# Patient Record
Sex: Female | Born: 1950 | Race: White | Hispanic: No | Marital: Married | Smoking: Former smoker
Health system: Southern US, Community
[De-identification: ages and names within clinical notes are randomized; demographics above are authoritative.]

## PROBLEM LIST (undated history)

## (undated) DIAGNOSIS — Z780 Asymptomatic menopausal state: Secondary | ICD-10-CM

## (undated) DIAGNOSIS — M1990 Unspecified osteoarthritis, unspecified site: Secondary | ICD-10-CM

## (undated) DIAGNOSIS — F32A Depression, unspecified: Secondary | ICD-10-CM

## (undated) DIAGNOSIS — C801 Malignant (primary) neoplasm, unspecified: Secondary | ICD-10-CM

## (undated) DIAGNOSIS — E785 Hyperlipidemia, unspecified: Secondary | ICD-10-CM

## (undated) DIAGNOSIS — K219 Gastro-esophageal reflux disease without esophagitis: Secondary | ICD-10-CM

## (undated) DIAGNOSIS — S86012A Strain of left Achilles tendon, initial encounter: Secondary | ICD-10-CM

## (undated) DIAGNOSIS — F329 Major depressive disorder, single episode, unspecified: Secondary | ICD-10-CM

## (undated) DIAGNOSIS — D0510 Intraductal carcinoma in situ of unspecified breast: Secondary | ICD-10-CM

## (undated) HISTORY — DX: Hyperlipidemia, unspecified: E78.5

## (undated) HISTORY — DX: Gastro-esophageal reflux disease without esophagitis: K21.9

## (undated) HISTORY — DX: Malignant (primary) neoplasm, unspecified: C80.1

## (undated) HISTORY — DX: Intraductal carcinoma in situ of unspecified breast: D05.10

## (undated) HISTORY — DX: Depression, unspecified: F32.A

## (undated) HISTORY — PX: BREAST SURGERY: SHX581

## (undated) HISTORY — DX: Unspecified osteoarthritis, unspecified site: M19.90

## (undated) HISTORY — DX: Asymptomatic menopausal state: Z78.0

## (undated) HISTORY — DX: Major depressive disorder, single episode, unspecified: F32.9

---

## 1991-06-27 HISTORY — PX: OOPHORECTOMY: SHX86

## 1993-05-26 HISTORY — PX: ABDOMINAL HYSTERECTOMY: SHX81

## 1997-06-26 HISTORY — PX: ANTERIOR CERVICAL DECOMP/DISCECTOMY FUSION: SHX1161

## 2005-08-24 HISTORY — PX: CHOLECYSTECTOMY: SHX55

## 2005-08-26 ENCOUNTER — Observation Stay (HOSPITAL_COMMUNITY): Admission: EM | Admit: 2005-08-26 | Discharge: 2005-08-26 | Payer: Self-pay | Admitting: Emergency Medicine

## 2005-08-28 ENCOUNTER — Inpatient Hospital Stay (HOSPITAL_COMMUNITY): Admission: EM | Admit: 2005-08-28 | Discharge: 2005-08-30 | Payer: Self-pay | Admitting: Emergency Medicine

## 2005-10-12 ENCOUNTER — Ambulatory Visit (HOSPITAL_COMMUNITY): Admission: RE | Admit: 2005-10-12 | Discharge: 2005-10-12 | Payer: Self-pay | Admitting: Surgery

## 2005-10-12 ENCOUNTER — Encounter (INDEPENDENT_AMBULATORY_CARE_PROVIDER_SITE_OTHER): Payer: Self-pay | Admitting: *Deleted

## 2010-02-24 DIAGNOSIS — D0510 Intraductal carcinoma in situ of unspecified breast: Secondary | ICD-10-CM

## 2010-02-24 HISTORY — DX: Intraductal carcinoma in situ of unspecified breast: D05.10

## 2010-03-21 ENCOUNTER — Encounter: Admission: RE | Admit: 2010-03-21 | Discharge: 2010-03-21 | Payer: Self-pay | Admitting: Radiology

## 2010-04-20 ENCOUNTER — Encounter: Admission: RE | Admit: 2010-04-20 | Discharge: 2010-04-20 | Payer: Self-pay | Admitting: General Surgery

## 2010-04-22 ENCOUNTER — Encounter: Admission: RE | Admit: 2010-04-22 | Discharge: 2010-04-22 | Payer: Self-pay | Admitting: General Surgery

## 2010-04-22 ENCOUNTER — Ambulatory Visit (HOSPITAL_BASED_OUTPATIENT_CLINIC_OR_DEPARTMENT_OTHER): Admission: RE | Admit: 2010-04-22 | Discharge: 2010-04-22 | Payer: Self-pay | Admitting: General Surgery

## 2010-04-28 ENCOUNTER — Ambulatory Visit: Payer: Self-pay | Admitting: Oncology

## 2010-05-03 ENCOUNTER — Ambulatory Visit
Admission: RE | Admit: 2010-05-03 | Discharge: 2010-07-21 | Payer: Self-pay | Source: Home / Self Care | Attending: Radiation Oncology | Admitting: Radiation Oncology

## 2010-05-23 LAB — CBC WITH DIFFERENTIAL/PLATELET
Basophils Absolute: 0.1 10*3/uL (ref 0.0–0.1)
Eosinophils Absolute: 0.3 10*3/uL (ref 0.0–0.5)
HGB: 13.1 g/dL (ref 11.6–15.9)
MCV: 91.1 fL (ref 79.5–101.0)
MONO#: 0.7 10*3/uL (ref 0.1–0.9)
MONO%: 8.2 % (ref 0.0–14.0)
NEUT#: 5.6 10*3/uL (ref 1.5–6.5)
Platelets: 276 10*3/uL (ref 145–400)
RDW: 13.8 % (ref 11.2–14.5)
WBC: 8.6 10*3/uL (ref 3.9–10.3)

## 2010-05-23 LAB — COMPREHENSIVE METABOLIC PANEL
Albumin: 4.2 g/dL (ref 3.5–5.2)
Alkaline Phosphatase: 96 U/L (ref 39–117)
BUN: 18 mg/dL (ref 6–23)
CO2: 27 mEq/L (ref 19–32)
Calcium: 9.5 mg/dL (ref 8.4–10.5)
Glucose, Bld: 98 mg/dL (ref 70–99)
Potassium: 4.7 mEq/L (ref 3.5–5.3)
Sodium: 141 mEq/L (ref 135–145)
Total Protein: 6.7 g/dL (ref 6.0–8.3)

## 2010-07-18 ENCOUNTER — Ambulatory Visit: Payer: Self-pay | Admitting: Oncology

## 2010-07-20 LAB — CBC WITH DIFFERENTIAL/PLATELET
BASO%: 0 % (ref 0.0–2.0)
EOS%: 4 % (ref 0.0–7.0)
MCH: 31 pg (ref 25.1–34.0)
MCHC: 34 g/dL (ref 31.5–36.0)
MONO%: 8.6 % (ref 0.0–14.0)
RBC: 4.47 10*6/uL (ref 3.70–5.45)
RDW: 13.4 % (ref 11.2–14.5)
lymph#: 0.9 10*3/uL (ref 0.9–3.3)

## 2010-07-20 LAB — BASIC METABOLIC PANEL
Chloride: 101 mEq/L (ref 96–112)
Potassium: 4.2 mEq/L (ref 3.5–5.3)

## 2010-07-27 ENCOUNTER — Ambulatory Visit: Payer: Self-pay | Admitting: Radiation Oncology

## 2010-08-08 ENCOUNTER — Encounter (HOSPITAL_BASED_OUTPATIENT_CLINIC_OR_DEPARTMENT_OTHER): Payer: BC Managed Care – PPO | Admitting: Oncology

## 2010-08-08 DIAGNOSIS — Z800 Family history of malignant neoplasm of digestive organs: Secondary | ICD-10-CM

## 2010-08-08 DIAGNOSIS — D0590 Unspecified type of carcinoma in situ of unspecified breast: Secondary | ICD-10-CM

## 2010-08-08 DIAGNOSIS — I10 Essential (primary) hypertension: Secondary | ICD-10-CM

## 2010-08-08 DIAGNOSIS — C50419 Malignant neoplasm of upper-outer quadrant of unspecified female breast: Secondary | ICD-10-CM

## 2010-08-18 ENCOUNTER — Ambulatory Visit: Payer: BC Managed Care – PPO | Attending: Radiation Oncology | Admitting: Radiation Oncology

## 2010-09-07 LAB — COMPREHENSIVE METABOLIC PANEL
ALT: 50 U/L — ABNORMAL HIGH (ref 0–35)
AST: 35 U/L (ref 0–37)
Albumin: 3.9 g/dL (ref 3.5–5.2)
Alkaline Phosphatase: 104 U/L (ref 39–117)
BUN: 17 mg/dL (ref 6–23)
Chloride: 102 mEq/L (ref 96–112)
GFR calc Af Amer: >60 mL/min (ref >60)
Potassium: 4.6 mEq/L (ref 3.5–5.1)
Sodium: 141 mEq/L (ref 135–145)
Total Bilirubin: 1 mg/dL (ref 0.3–1.2)
Total Protein: 7.3 g/dL (ref 6.0–8.3)

## 2010-09-07 LAB — DIFFERENTIAL
Basophils Absolute: 0.1 10*3/uL (ref 0.0–0.1)
Basophils Relative: 1 % (ref 0–1)
Eosinophils Relative: 3 % (ref 0–5)
Monocytes Absolute: 1 10*3/uL (ref 0.1–1.0)
Monocytes Relative: 9 % (ref 3–12)
Neutro Abs: 7.7 10*3/uL (ref 1.7–7.7)

## 2010-09-07 LAB — CBC
MCV: 91.1 fL (ref 78.0–100.0)
Platelets: 295 10*3/uL (ref 150–400)
RBC: 4.82 MIL/uL (ref 3.87–5.11)
RDW: 13.6 % (ref 11.5–15.5)
WBC: 11 10*3/uL — ABNORMAL HIGH (ref 4.0–10.5)

## 2010-09-07 LAB — CANCER ANTIGEN 27.29: CA 27.29: 47 U/mL — ABNORMAL HIGH (ref 0–39)

## 2010-09-15 ENCOUNTER — Ambulatory Visit: Payer: BC Managed Care – PPO | Admitting: Internal Medicine

## 2010-10-17 ENCOUNTER — Other Ambulatory Visit: Payer: Self-pay | Admitting: Oncology

## 2010-10-17 ENCOUNTER — Encounter (HOSPITAL_BASED_OUTPATIENT_CLINIC_OR_DEPARTMENT_OTHER): Payer: BC Managed Care – PPO | Admitting: Oncology

## 2010-10-17 DIAGNOSIS — F329 Major depressive disorder, single episode, unspecified: Secondary | ICD-10-CM

## 2010-10-17 DIAGNOSIS — Z800 Family history of malignant neoplasm of digestive organs: Secondary | ICD-10-CM

## 2010-10-17 DIAGNOSIS — C50419 Malignant neoplasm of upper-outer quadrant of unspecified female breast: Secondary | ICD-10-CM

## 2010-10-17 DIAGNOSIS — I10 Essential (primary) hypertension: Secondary | ICD-10-CM

## 2010-10-17 DIAGNOSIS — D0590 Unspecified type of carcinoma in situ of unspecified breast: Secondary | ICD-10-CM

## 2010-10-17 LAB — CBC WITH DIFFERENTIAL/PLATELET
BASO%: 0.4 % (ref 0.0–2.0)
Eosinophils Absolute: 0.1 10*3/uL (ref 0.0–0.5)
HCT: 40.7 % (ref 34.8–46.6)
LYMPH%: 17.8 % (ref 14.0–49.7)
MCHC: 33.8 g/dL (ref 31.5–36.0)
MCV: 92.1 fL (ref 79.5–101.0)
MONO#: 0.6 10*3/uL (ref 0.1–0.9)
MONO%: 6.7 % (ref 0.0–14.0)
NEUT%: 73.8 % (ref 38.4–76.8)
Platelets: 255 10*3/uL (ref 145–400)
WBC: 9.6 10*3/uL (ref 3.9–10.3)

## 2010-10-17 LAB — COMPREHENSIVE METABOLIC PANEL
Alkaline Phosphatase: 62 U/L (ref 39–117)
CO2: 23 mEq/L (ref 19–32)
Creatinine, Ser: 0.84 mg/dL (ref 0.40–1.20)
Glucose, Bld: 146 mg/dL — ABNORMAL HIGH (ref 70–99)
Total Bilirubin: 0.5 mg/dL (ref 0.3–1.2)

## 2010-11-02 ENCOUNTER — Encounter (INDEPENDENT_AMBULATORY_CARE_PROVIDER_SITE_OTHER): Payer: Self-pay | Admitting: General Surgery

## 2010-11-03 ENCOUNTER — Ambulatory Visit (INDEPENDENT_AMBULATORY_CARE_PROVIDER_SITE_OTHER): Payer: BC Managed Care – PPO | Admitting: Internal Medicine

## 2010-11-03 DIAGNOSIS — C50919 Malignant neoplasm of unspecified site of unspecified female breast: Secondary | ICD-10-CM

## 2010-11-03 DIAGNOSIS — E785 Hyperlipidemia, unspecified: Secondary | ICD-10-CM

## 2010-11-03 DIAGNOSIS — N951 Menopausal and female climacteric states: Secondary | ICD-10-CM

## 2010-11-03 DIAGNOSIS — F329 Major depressive disorder, single episode, unspecified: Secondary | ICD-10-CM

## 2010-11-11 NOTE — Discharge Summary (Signed)
[name], [name]             ACCOUNT NO.:  [id number]   MEDICAL RECORD NO.:  [id number]          PATIENT TYPE:  INP   LOCATION:  3705                         FACILITY:  [facility]   PHYSICIAN:  Vesta Mixer, M.D. DATE OF BIRTH:  01/12/1951   DATE OF ADMISSION:  08/28/2005  DATE OF DISCHARGE:  08/30/2005                                 DISCHARGE SUMMARY   DISCHARGE DIAGNOSES:  1.  Noncardiac chest pain.  2.  History of anxiety.  3.  Hyperlipidemia.   DISCHARGE MEDICATIONS:  1.  Lipitor 40 milligrams a day.  2.  Premarin 0.625 milligrams a day.  3.  Wellbutrin XL 300 milligrams day.  4.  Ambien 5 milligrams a day.  5.  Vitamin E and multivitamin once a day.   DISPOSITION:  The patient will return to Dr. Marny Lowenstein for further evaluation  of her noncardiac chest pain. She will see Dr. Elease Hashimoto as needed.   HISTORY:  [name] is a 60 year old female who was admitted with  episodes of chest pain. Please see dictated H&P for further details.   HOSPITAL COURSE:  Problem 1: CHEST PAIN: The patient ruled out for  myocardial infarction. Because this was her second admission for the same  diagnosis, we decided to do a heart catheterization to make sure that she  did not have coronary artery disease. She had a heart catheterization on  August 29, 2005. She had smooth and normal coronary arteries. She had normal  left ventricular systolic function. She was kept overnight and did quite  well. She has not had any further episodes of chest pain while in the  hospital. She will need to see Dr. Marny Lowenstein for further workup as an  outpatient. I suspect that she will need a GI evaluation. All of her other  medical problems are stable.           ______________________________  Vesta Mixer, M.D.     PJN/MEDQ  D:  08/30/2005  T:  08/30/2005  Job:  [id number]   cc:   Sharlet Salina, M.D.  Fax: [phone]

## 2010-11-11 NOTE — Op Note (Signed)
NAMETAHIRI, SHAREEF             ACCOUNT NO.:  [id number]   MEDICAL RECORD NO.:  [id number]          PATIENT TYPE:  AMB   LOCATION:  DAY                          FACILITY:  [facility]   PHYSICIAN:  Wilmon Arms. Corliss Skains, M.D. DATE OF BIRTH:  [birth year]   DATE OF PROCEDURE:  10/12/2005  DATE OF DISCHARGE:                                 OPERATIVE REPORT   PREOPERATIVE DIAGNOSIS:  Biliary dyskinesia.   POSTOPERATIVE DIAGNOSIS:  Biliary dyskinesia.   PROCEDURE PERFORMED:  Laparoscopic cholecystectomy, interoperative  cholangiogram.   SURGEON:  Wilmon Arms. Corliss Skains, M.D.   ASSISTANT:  Rose Phi. Maple Hudson, M.D.   ANESTHESIA:  General endotracheal anesthesia.   INDICATIONS:  The patient is a 60 year old female who recently was  hospitalized with chest pain.  She underwent a thorough cardiac workup which  was negative.  She had an ultrasound which showed no evidence of gallstones.  A CCK HIDA scan was then obtained which showed a decreased ejection  fraction.  The patient's symptoms included nausea, bloating, belching, and  pain radiating through to her back.  It was felt that her symptoms were due  to her gallbladder.  Therefore, we recommended laparoscopic cholecystectomy.   DESCRIPTION OF PROCEDURE:  The patient was brought to the operating room and  placed in the supine position on the operating table.  After an adequate  level of general endotracheal anesthesia was obtained, the patient's abdomen  was prepped with Betadine and draped in a sterile fashion.  A transverse  incision was made just below the umbilicus after infiltrating with 0.25%  Marcaine.  Dissection was carried down to the fascia which was opened  vertically.  The peritoneal cavity was bluntly entered.  A purse-string  suture of 0 Vicryl was placed around the fascial opening.  The Hasson  cannula was inserted and pneumoperitoneum was obtained by insufflating CO2  maintaining maximal pressure of 50 mmHg.  The patient was rotated  in  reversed Trendelenburg position and rotated to her left.  The laparoscope  was inserted and the gallbladder was visualized.  There were multiple  adhesions to the surface of the gallbladder.  The stomach appeared normal.  The liver appeared normal and the patient had some omental adhesions in the  lower midline.  A 10 mm port was placed in the subxiphoid position.  Two 5  mm ports placed in the right upper quadrant.  The fundus of the gallbladder  was grasped with a clamp and elevated.  Blunt dissection and cautery were  used to remove the adhesions from the surface of the gallbladder.  The  gallbladder was retracted up over the edge of the liver.  We opened the  peritoneum around the hilum of the gallbladder.  The cystic duct was  circumferentially dissected.  A small lateral cystic artery branch was also  dissected, ligated with clips, and divided.  The main cystic artery was also  ligated, clipped, and divided.  The cystic duct was ligated and clipped  distally.  The scissors were used to create an opening on the cystic duct  and a Cook cholangiogram catheter was inserted through  a stab incision and  threaded into the cystic duct.  A cholangiogram was obtained which showed  good flow proximally and distally in the biliary tree with no evidence of  obstruction.  There was good flow into the duodenum.  The cholangiogram  catheter was then removed.  The cystic duct was ligated, clipped and  divided.  Cautery was then used to remove the gallbladder from the liver  bed.  There was a small venous lake on the wall of the gallbladder fossa.  This area was thoroughly cauterized for hemostasis.  A tiny hole was created  in the wall of gallbladder inadvertently with minimal bile spillage.  There  were no stones noted.  Once the gallbladder was amputated, a piece of  Surgicel was placed in the gallbladder fossa to aid in hemostasis.  The  right upper quadrant was thoroughly irrigated.  No  further bleeding was  noted.  The gallbladder was placed in an EndoCatch sac and removed through  the umbilical port site.  The pursestring suture was tied down.  There was  still a small air leak so an additional 0 Vicryl suture was used to complete  the umbilical fascial closure.  We reinspected the right upper quadrant.  The irrigant was suctioned out.  No further bleeding was noted.  Pneumoperitoneum was released as the ports were removed under direct vision.  The wounds were all closed with 4-0 Monocryl suture in subcuticular fashion.  Steri-Strips and clean dressings were applied.  The patient was then  extubated and brought to the recovery in stable condition.  All sponge,  instrument, and needle counts correct.      Wilmon Arms. Tsuei, M.D.  Electronically Signed     MKT/MEDQ  D:  10/12/2005  T:  10/12/2005  Job:  [id number]   cc:   Sharlet Salina, M.D.  Fax: [phone]

## 2010-11-11 NOTE — Consult Note (Signed)
[name], [name]             ACCOUNT NO.:  [id number]   MEDICAL RECORD NO.:  [id number]          PATIENT TYPE:  INP   LOCATION:  4707                         FACILITY:  [facility]   PHYSICIAN:  Vesta Mixer, M.D. DATE OF BIRTH:  1950-12-17   DATE OF CONSULTATION:  08/26/2005  DATE OF DISCHARGE:                                   CONSULTATION   HISTORY OF PRESENT ILLNESS:  [name] is a 60 year old female with  history of hyperlipidemia. She was admitted last night with some  intermittent episodes of chest pain. The patient has never had any cardiac  problems before. She does have a history of hypercholesterolemia. Ms.  Knight Knight levels have been largely controlled on medical  therapy. For the past 3 days, she has had intermittent episodes of chest  pain. These are located just below her breast. It is a band-like sensation.  She thought maybe it was due to indigestion. There is no association with  eating, drinking, change in position, taking a deep breath, or exercising.  She was admitted to the hospital last night and we were asked to see her for  consultation.   CURRENT MEDICATIONS:  1.  Lipitor 40 mg daily.  2.  Premarin 0.625 mg daily.  3.  Wellbutrin XL 300 mg.  4.  Ambien as needed.   ALLERGIES:  NO KNOWN DRUG ALLERGIES.   PAST MEDICAL HISTORY:  Hyperlipidemia.   SOCIAL HISTORY:  Non-smoker.   FAMILY HISTORY:  Noncontributory.   PHYSICAL EXAMINATION:  GENERAL:  A middle aged female in no acute distress.  NEUROLOGIC:  Alert and oriented times three. Mood and affect are normal. Non-  focal examination.  VITAL SIGNS:  Temperature 97.7, pulse 73, blood pressure 94/58.  HEENT:  Examination reveals 2+ carotids. No bruits, no jugular venous  distention, no thyromegaly.  LUNGS:  Clear to auscultation.  HEART:  Regular rate. S1 and S2. She has no murmurs.  ABDOMEN:  Good bowel sounds. Non-tender.  EXTREMITIES:  No clubbing, cyanosis, or edema.   LABORATORY DATA:  EKG reveals normal sinus rhythm. There are no ST or T wave  changes.   Cardiac enzymes are negative x3 sets.   IMPRESSION/PLAN:  [name] presents with an episode of atypical chest  pain. She does have several risk factors for coronary artery disease  including hyperlipidemia and obesity. I agree with the need for a stress  Cardiolite study. We will be able to get this stress Cardiolite study as an  outpatient. It will be okay for her to be discharged tonight, since she has  had 3 sets of negative enzymes. She has my card and will call me for further  questions.           ______________________________  Vesta Mixer, M.D.     PJN/MEDQ  D:  08/26/2005  T:  08/27/2005  Job:  [id number]   cc:   Melissa L. Ladona Ridgel, MD

## 2010-11-11 NOTE — H&P (Signed)
NAMECORTNEY, [name]             ACCOUNT NO.:  [id number]   MEDICAL RECORD NO.:  [id number]          PATIENT TYPE:  INP   LOCATION:  3705                         FACILITY:  [facility]   PHYSICIAN:  Vesta Mixer, M.D. DATE OF BIRTH:  Mar 22, 1951   DATE OF ADMISSION:  08/28/2005  DATE OF DISCHARGE:                                HISTORY & PHYSICAL   [name] is a 60 year old female who is admitted with chest and back pain.   The patient was recently just admitted by [facility] hospitalists for episodes of  back pain.  Fairly extensive work-up at that time included negative cardiac  enzymes x3.  She had a negative D-dimer.  She also had normal EKG.  She was  discharged and the plan was to have her return for an outpatient Cardiolite  study.  She started having minor chest pains yesterday and this morning woke  with severe chest and back pain.  The pain radiates through to her back and  seems to be centered across her chest.  It occurred four separate times this  morning.  It lasted anywhere from 40-45 minutes and occurred and resolved  spontaneously.  It was not associated with any specific activity such as  eating, drinking, change in position, taking a deep breath, or exercise.  She was hurting off and on all day and presented back to the emergency room  for further evaluation.   CURRENT MEDICATIONS:  1.  Lipitor 40 mg a day.  2.  Premarin 0.625 mg a day.  3.  Wellbutrin XL 300 mg a day.  4.  Ambien 5 mg a day.   No known drug allergies.   PAST MEDICAL HISTORY:  1.  History of chest pain.  2.  History of depression.  3.  Hypercholesterolemia.   SOCIAL HISTORY:  The patient quit smoking several months ago.   FAMILY HISTORY:  Unremarkable.   REVIEW OF SYSTEMS:  Reviewed and is essentially negative.   PHYSICAL EXAMINATION:  GENERAL:  She is a middle-aged female in no acute  distress.  She is alert and oriented x3 and her mood and affect are normal.  VITAL SIGNS:  Blood pressure  139/83, heart rate 81.  HEENT:  2+ carotids.  No bruits.  No JVD.  No thyromegaly.  LUNGS:  Clear to auscultation.  HEART:  Regular rate.  S1, S2 with no murmurs.  ABDOMEN:  Good bowel sounds.  Nontender.  EXTREMITIES:  She has no calf or thigh tenderness or palpable cords.  She  has good pulses.  NEUROLOGIC:  Nonfocal.   LABORATORY DATA:  Pending.   EKG reveals normal sinus rhythm.  The EKG is essentially normal.   The patient presents with recurrent chest pain radiating through to her  back.  The differential diagnosis includes coronary artery disease,  pulmonary embolus, musculoskeletal disease and the GI etiology.  At this  point I would favor heart catheterization to rule out coronary artery  disease.  She already has several risk factors for coronary artery disease  including hyperlipidemia and a history of cigarette smoking.  Her symptoms  are somewhat atypical.  I do also think that we need to do a spiral CT to rule out a pulmonary  embolus.  She is on Premarin, is a long-term smoker.  D-dimer was normal  several days ago.  We will proceed with heart catheterization tomorrow.  We  have discussed the risks, benefits, and options of heart catheterization.  She understands and agrees to proceed.  I have discussed the case with Dr.  Virginia Rochester.  They will be following along to help with general medical  problems.           ______________________________  Vesta Mixer, M.D.     PJN/MEDQ  D:  08/28/2005  T:  08/29/2005  Job:  [id number]   cc:   Sharlet Salina, M.D.  Fax: [phone]

## 2010-11-11 NOTE — Cardiovascular Report (Signed)
NAMEJASELYNN, [name]             ACCOUNT NO.:  [id number]   MEDICAL RECORD NO.:  [id number]          PATIENT TYPE:  INP   LOCATION:  3705                         FACILITY:  [facility]   PHYSICIAN:  Vesta Mixer, M.D. DATE OF BIRTH:  Oct 29, 1950   DATE OF PROCEDURE:  08/29/2005  DATE OF DISCHARGE:  08/30/2005                              CARDIAC CATHETERIZATION   [place] a 60 year old female with several weeks of chest pain.  These  episodes of chest pain occur spontaneously.  They are not exacerbated by any  specific activity.   She was recently discharged from the hospital but represented yesterday with  further episodes of chest pain.  She is now referred for heart  catheterization for further evaluation.   PROCEDURE:  Left heart catheterization with coronary angiography.   HEMODYNAMIC RESULTS:  The LV pressure is 128/70 with an aortic pressure of  128/71.   ANGIOGRAPHY:  1.  Left main:  The left main is smooth and normal.  2.  Left anterior descending artery is normal.  There is a very small first      diagonal vessel, which is normal.  3.  The left circumflex artery is a normal vessel.  The obtuse marginal      artery is small.  It is smooth and normal.  The ramus intermediate      branch is a small to moderate-sized vessel.  It is normal.  4.  The right coronary artery is very small and has an anterior takeoff.  It      does provide flow to a small posterior descending artery and a small      posterolateral segment artery.  The vessel is normal.   The left ventriculogram was performed in a 30 RAO position.  It reveals  overall normal left ventricular systolic function.  The ejection fraction is  65-70%.  There is no mitral regurgitation.   COMPLICATIONS:  None.   CONCLUSION:  1.  Smooth and normal coronary arteries.  2.  Normal left ventricular systolic function.           ______________________________  Vesta Mixer, M.D.     PJN/MEDQ  D:  08/29/2005  T:   08/30/2005  Job:  [id number]   cc:   Sharlet Salina, M.D.  Fax: [phone]

## 2010-11-23 ENCOUNTER — Encounter: Payer: Self-pay | Admitting: Internal Medicine

## 2010-11-25 ENCOUNTER — Encounter: Payer: Self-pay | Admitting: Internal Medicine

## 2010-12-01 ENCOUNTER — Ambulatory Visit (INDEPENDENT_AMBULATORY_CARE_PROVIDER_SITE_OTHER): Payer: BC Managed Care – PPO | Admitting: Internal Medicine

## 2010-12-01 DIAGNOSIS — F329 Major depressive disorder, single episode, unspecified: Secondary | ICD-10-CM

## 2010-12-01 DIAGNOSIS — E559 Vitamin D deficiency, unspecified: Secondary | ICD-10-CM

## 2010-12-01 DIAGNOSIS — I10 Essential (primary) hypertension: Secondary | ICD-10-CM

## 2010-12-01 DIAGNOSIS — Z0000 Encounter for general adult medical examination without abnormal findings: Secondary | ICD-10-CM

## 2011-01-11 ENCOUNTER — Encounter (INDEPENDENT_AMBULATORY_CARE_PROVIDER_SITE_OTHER): Payer: BC Managed Care – PPO | Admitting: General Surgery

## 2011-01-23 ENCOUNTER — Encounter (INDEPENDENT_AMBULATORY_CARE_PROVIDER_SITE_OTHER): Payer: Self-pay

## 2011-01-25 ENCOUNTER — Ambulatory Visit (INDEPENDENT_AMBULATORY_CARE_PROVIDER_SITE_OTHER): Payer: BC Managed Care – PPO | Admitting: General Surgery

## 2011-01-25 DIAGNOSIS — D0510 Intraductal carcinoma in situ of unspecified breast: Secondary | ICD-10-CM | POA: Insufficient documentation

## 2011-01-25 DIAGNOSIS — D0590 Unspecified type of carcinoma in situ of unspecified breast: Secondary | ICD-10-CM

## 2011-01-25 NOTE — Progress Notes (Signed)
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 Occasionally she has some soreness around the incision site. She denies any palpable breast masses. She denies enlarged lymph nodes and axillary area or the neck. She recently had her mammogram at [facility]. This was a BI-RADS 2 indicating benign findings.  PE: Generally, she looks well in no acute distress.  Right breast demonstrates a well-healed upper outer quadrant scar. Radiation changes to the skin are noted. No dominant masses are felt.  Left breast is larger than the right. It is soft without dominant masses, suspicious skin changes, or nipple discharge.  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  Plan: Return visit in 3-4 months.

## 2011-03-14 ENCOUNTER — Other Ambulatory Visit: Payer: Self-pay | Admitting: Plastic Surgery

## 2011-03-24 HISTORY — PX: BREAST REDUCTION SURGERY: SHX8

## 2011-04-04 ENCOUNTER — Encounter (INDEPENDENT_AMBULATORY_CARE_PROVIDER_SITE_OTHER): Payer: Self-pay | Admitting: General Surgery

## 2011-04-19 ENCOUNTER — Other Ambulatory Visit: Payer: Self-pay | Admitting: Oncology

## 2011-04-19 ENCOUNTER — Encounter (HOSPITAL_BASED_OUTPATIENT_CLINIC_OR_DEPARTMENT_OTHER): Payer: BC Managed Care – PPO | Admitting: Oncology

## 2011-04-19 DIAGNOSIS — Z800 Family history of malignant neoplasm of digestive organs: Secondary | ICD-10-CM

## 2011-04-19 DIAGNOSIS — F329 Major depressive disorder, single episode, unspecified: Secondary | ICD-10-CM

## 2011-04-19 DIAGNOSIS — D0590 Unspecified type of carcinoma in situ of unspecified breast: Secondary | ICD-10-CM

## 2011-04-19 DIAGNOSIS — F3289 Other specified depressive episodes: Secondary | ICD-10-CM

## 2011-04-19 DIAGNOSIS — I10 Essential (primary) hypertension: Secondary | ICD-10-CM

## 2011-04-19 DIAGNOSIS — C50419 Malignant neoplasm of upper-outer quadrant of unspecified female breast: Secondary | ICD-10-CM

## 2011-04-19 LAB — CBC WITH DIFFERENTIAL/PLATELET
Basophils Absolute: 0 10*3/uL (ref 0.0–0.1)
HCT: 38.1 % (ref 34.8–46.6)
HGB: 13 g/dL (ref 11.6–15.9)
LYMPH%: 18.8 % (ref 14.0–49.7)
MCH: 30.9 pg (ref 25.1–34.0)
MONO#: 0.6 10*3/uL (ref 0.1–0.9)
NEUT%: 72.2 % (ref 38.4–76.8)
Platelets: 348 10*3/uL (ref 145–400)
WBC: 9.7 10*3/uL (ref 3.9–10.3)
lymph#: 1.8 10*3/uL (ref 0.9–3.3)

## 2011-04-19 LAB — COMPREHENSIVE METABOLIC PANEL
BUN: 13 mg/dL (ref 6–23)
CO2: 28 mEq/L (ref 19–32)
Calcium: 9.5 mg/dL (ref 8.4–10.5)
Chloride: 102 mEq/L (ref 96–112)
Creatinine, Ser: 0.84 mg/dL (ref 0.50–1.10)
Total Bilirubin: 0.6 mg/dL (ref 0.3–1.2)

## 2011-05-10 ENCOUNTER — Ambulatory Visit (INDEPENDENT_AMBULATORY_CARE_PROVIDER_SITE_OTHER): Payer: BC Managed Care – PPO | Admitting: General Surgery

## 2011-05-10 ENCOUNTER — Encounter (INDEPENDENT_AMBULATORY_CARE_PROVIDER_SITE_OTHER): Payer: Self-pay | Admitting: General Surgery

## 2011-05-10 VITALS — BP 122/84 | HR 80 | Temp 97.3°F | Resp 20 | Ht 63.0 in | Wt 203.5 lb

## 2011-05-10 DIAGNOSIS — D0510 Intraductal carcinoma in situ of unspecified breast: Secondary | ICD-10-CM

## 2011-05-10 DIAGNOSIS — D0590 Unspecified type of carcinoma in situ of unspecified breast: Secondary | ICD-10-CM

## 2011-05-10 NOTE — Progress Notes (Signed)
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 She had a right breast reconstruction in her left breast reduction by Dr. Odis Luster 8 weeks ago. She denies feeling any masses in her breast. She denies any lymphadenopathy. She had a mammogram back in July 2012 at [facility] and we do not have a copy of that yet.  PE: Generally, she looks well in no acute distress.  Right breast demonstrates a well-healed upper outer quadrant scar. Periareolar and inferior breast scars are noted with a small open healing area. No masses felt.  Left breast has periareolar and inferior scars present. No masses palpable.  Nodes-no palpable axillary, cervical, or supraclavicular adenopathy.  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  Awaiting [facility] report.  Plan: Return visit in 3-4 months.

## 2011-05-10 NOTE — Patient Instructions (Signed)
Call if you feel any lumps in your breasts.

## 2011-06-19 ENCOUNTER — Other Ambulatory Visit: Payer: Self-pay | Admitting: *Deleted

## 2011-06-19 ENCOUNTER — Telehealth: Payer: Self-pay | Admitting: *Deleted

## 2011-06-19 DIAGNOSIS — C50419 Malignant neoplasm of upper-outer quadrant of unspecified female breast: Secondary | ICD-10-CM

## 2011-06-19 MED ORDER — VENLAFAXINE HCL 75 MG PO TABS: 75.0000 mg | ORAL_TABLET | Freq: Every day | ORAL | Status: DC

## 2011-06-19 NOTE — Telephone Encounter (Signed)
Weston Brass, Pharmacist- from [facility] called for Clarification on Effexor 75mg - Pt was taking Effexor 75mg   XR- refill ordered for Effexor tablet.(not XR) Reviewed with MD-VO- Continue Effexor XR 75mg . Verbal order -read back & confirmed with Weston Brass- Pharmacist at [facility] aid.

## 2011-09-06 ENCOUNTER — Encounter (INDEPENDENT_AMBULATORY_CARE_PROVIDER_SITE_OTHER): Payer: Self-pay | Admitting: General Surgery

## 2011-09-07 ENCOUNTER — Encounter (INDEPENDENT_AMBULATORY_CARE_PROVIDER_SITE_OTHER): Payer: Self-pay | Admitting: General Surgery

## 2011-09-07 ENCOUNTER — Ambulatory Visit (INDEPENDENT_AMBULATORY_CARE_PROVIDER_SITE_OTHER): Payer: BC Managed Care – PPO | Admitting: General Surgery

## 2011-09-07 VITALS — BP 136/89 | HR 109 | Temp 98.0°F | Ht 63.0 in | Wt 196.2 lb

## 2011-09-07 DIAGNOSIS — Z853 Personal history of malignant neoplasm of breast: Secondary | ICD-10-CM

## 2011-09-07 NOTE — Progress Notes (Signed)
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 She had a right breast reconstruction in her left breast reduction by Dr. Odis Luster last September and these wounds have all healed. She denies feeling any masses in her breast. She denies any lymphadenopathy. She had a mammogram back in July 2012 at [facility] which did not show any evidence of malignancy.  PE: Generally, she looks well in no acute distress.  Right breast demonstrates a well-healed upper outer quadrant scar. Periareolar and inferior breast scars are well healed. No masses felt.  Left breast has periareolar and inferior scars present. No masses palpable.  Nodes-no palpable axillary, cervical, or supraclavicular adenopathy.  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    Plan: Return visit in 3-4 months.

## 2011-09-07 NOTE — Patient Instructions (Signed)
Call if you feel any new masses in your breasts. 

## 2011-10-15 ENCOUNTER — Other Ambulatory Visit: Payer: Self-pay | Admitting: Oncology

## 2011-10-31 ENCOUNTER — Encounter (INDEPENDENT_AMBULATORY_CARE_PROVIDER_SITE_OTHER): Payer: Self-pay | Admitting: General Surgery

## 2011-11-03 ENCOUNTER — Other Ambulatory Visit: Payer: Self-pay | Admitting: *Deleted

## 2011-11-03 ENCOUNTER — Other Ambulatory Visit (HOSPITAL_BASED_OUTPATIENT_CLINIC_OR_DEPARTMENT_OTHER): Payer: BC Managed Care – PPO | Admitting: Lab

## 2011-11-03 ENCOUNTER — Encounter: Payer: Self-pay | Admitting: Oncology

## 2011-11-03 ENCOUNTER — Ambulatory Visit (HOSPITAL_BASED_OUTPATIENT_CLINIC_OR_DEPARTMENT_OTHER): Payer: BC Managed Care – PPO | Admitting: Oncology

## 2011-11-03 ENCOUNTER — Telehealth: Payer: Self-pay | Admitting: *Deleted

## 2011-11-03 VITALS — BP 126/82 | HR 99 | Temp 97.9°F | Ht 63.0 in | Wt 190.0 lb

## 2011-11-03 DIAGNOSIS — F329 Major depressive disorder, single episode, unspecified: Secondary | ICD-10-CM

## 2011-11-03 DIAGNOSIS — I10 Essential (primary) hypertension: Secondary | ICD-10-CM

## 2011-11-03 DIAGNOSIS — D0590 Unspecified type of carcinoma in situ of unspecified breast: Secondary | ICD-10-CM

## 2011-11-03 DIAGNOSIS — Z79810 Long term (current) use of selective estrogen receptor modulators (SERMs): Secondary | ICD-10-CM

## 2011-11-03 DIAGNOSIS — C50419 Malignant neoplasm of upper-outer quadrant of unspecified female breast: Secondary | ICD-10-CM

## 2011-11-03 DIAGNOSIS — D0510 Intraductal carcinoma in situ of unspecified breast: Secondary | ICD-10-CM

## 2011-11-03 DIAGNOSIS — Z800 Family history of malignant neoplasm of digestive organs: Secondary | ICD-10-CM

## 2011-11-03 LAB — COMPREHENSIVE METABOLIC PANEL
AST: 28 U/L (ref 0–37)
Albumin: 4.2 g/dL (ref 3.5–5.2)
Alkaline Phosphatase: 61 U/L (ref 39–117)
Calcium: 9.4 mg/dL (ref 8.4–10.5)
Chloride: 104 mEq/L (ref 96–112)
Potassium: 3.6 mEq/L (ref 3.5–5.3)
Sodium: 142 mEq/L (ref 135–145)
Total Protein: 6.4 g/dL (ref 6.0–8.3)

## 2011-11-03 LAB — CBC WITH DIFFERENTIAL/PLATELET
BASO%: 0.8 % (ref 0.0–2.0)
Basophils Absolute: 0.1 10*3/uL (ref 0.0–0.1)
EOS%: 3.6 % (ref 0.0–7.0)
HCT: 41.1 % (ref 34.8–46.6)
HGB: 13.6 g/dL (ref 11.6–15.9)
MCH: 30.6 pg (ref 25.1–34.0)
MCHC: 33.1 g/dL (ref 31.5–36.0)
MONO#: 0.7 10*3/uL (ref 0.1–0.9)
RDW: 13.5 % (ref 11.2–14.5)
WBC: 9.8 10*3/uL (ref 3.9–10.3)
lymph#: 1.9 10*3/uL (ref 0.9–3.3)

## 2011-11-03 MED ORDER — TAMOXIFEN CITRATE 10 MG PO TABS: 20.0000 mg | ORAL_TABLET | Freq: Every day | ORAL | Status: DC

## 2011-11-03 MED ORDER — VENLAFAXINE HCL ER 75 MG PO CP24: 75.0000 mg | ORAL_CAPSULE | Freq: Every day | ORAL | Status: DC

## 2011-11-03 MED ORDER — VENLAFAXINE HCL 75 MG PO TABS: 75.0000 mg | ORAL_TABLET | Freq: Every day | ORAL | Status: DC

## 2011-11-03 NOTE — Progress Notes (Signed)
OFFICE PROGRESS NOTE  CC  [name],[name], MD, MD [address] [place] [address] Dr. Avel Peace  Rema Fendt FNP Dr. 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  PRIOR THERAPY:  #1 patient underwent a lumpectomy on 04/22/2010 for a DCIS of the right breast.  #2 she then received radiation therapy to the right breast completing on [name] 27 2012.  #3 she was begun on tamoxifen 20 mg daily starting in February 2012. A total of 5 years of therapy is planned.  #4 patient does experience night sweats and hot flashes due to tamoxifen. She is currently on Effexor 75 mg daily for this. She also has depression so this is a secondary gain.  CURRENT THERAPY: Tamoxifen 20 mg daily with Effexor 75 mg daily  INTERVAL HISTORY: [name] 61 y.o. female returns for Followup visit today. Overall she's doing well she has done extremely well with the tamoxifen. She does have some sweats at night but they're very much bearable. She continues to work full-time. She has completed all of her breast reconstruction and reduction. She is very pleased about the results. She currently denies any headaches double vision blurring of vision no fevers chills she has no shortness of breath no chest pains or palpitations no myalgias or arthralgias she has not noticed any swelling in her lower extremities or calf tenderness no vaginal bleeding or discharge. Remainder of the 10 point review of systems is negative.  MEDICAL HISTORY: Past Medical History  Diagnosis Date  . Menopause   . Depression   . 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 Hyperlipidemia   . Hypertension   . GERD (gastroesophageal reflux disease)   . Allergic rhinitis   . Cancer     ALLERGIES:   has no known allergies.  MEDICATIONS:  Current Outpatient Prescriptions  Medication Sig Dispense Refill  . atorvastatin (LIPITOR) 40 MG tablet  Take 40 mg by mouth daily.        . Calcium Carbonate (CALCIUM 500 PO) Take 1,000 mg by mouth.        . hydrochlorothiazide 25 MG tablet Take 25 mg by mouth daily.        . Multiple Vitamin (MULTIVITAMIN) tablet Take 1 tablet by mouth daily.        . pantoprazole (PROTONIX) 40 MG tablet Take 40 mg by mouth daily.      . tamoxifen (NOLVADEX) 10 MG tablet Take 20 mg by mouth daily.       Marland Kitchen venlafaxine (EFFEXOR) 75 MG tablet Take 1 tablet (75 mg total) by mouth daily.  30 tablet  4  . venlafaxine XR (EFFEXOR-XR) 75 MG 24 hr capsule TAKE 1 CAPSULE BY MOUTH EVERY DAY  30 capsule  0  . vitamin D, CHOLECALCIFEROL, 400 UNITS tablet Take 1,000 Units by mouth daily.       . vitamin E 400 UNIT capsule Take 400 Units by mouth daily.        . Zinc 50 MG CAPS Take 1 capsule by mouth daily.        Marland Kitchen zolpidem (AMBIEN) 5 MG tablet Take 5 mg by mouth at bedtime as needed.          SURGICAL HISTORY:  Past Surgical History  Procedure Date  . Cholecystectomy 08/2005  . Oophorectomy 1993    one side  . Anterior cervical decomp/discectomy fusion 1999  .  Abdominal hysterectomy 05/1993  . Breast lumpectomy   . Breast reduction surgery 03/24/11    bilateral     REVIEW OF SYSTEMS:  Pertinent items are noted in HPI.   PHYSICAL EXAMINATION: General appearance: alert, cooperative and appears stated age Lymph nodes: Cervical, supraclavicular, and axillary nodes normal. Resp: clear to auscultation bilaterally and normal percussion bilaterally Back: symmetric, no curvature. ROM normal. No CVA tenderness. Cardio: regular rate and rhythm, S1, S2 normal, no murmur, click, rub or gallop GI: soft, non-tender; bowel sounds normal; no masses,  no organomegaly Extremities: extremities normal, atraumatic, no cyanosis or edema Neurologic: Grossly normal Bilateral breast examination right breast reveals well-healed surgical scar there are no masses no nodularities or nipple discharge. Left breast surgical scars are healed no  masses or nipple discharge. ECOG PERFORMANCE STATUS: 0 - Asymptomatic  Blood pressure 126/82, pulse 99, temperature 97.9 F (36.6 C), height 5\' 3"  (1.6 m), weight 190 lb (86.183 kg).  LABORATORY DATA: Lab Results  Component Value Date   WBC 9.8 11/03/2011   HGB 13.6 11/03/2011   HCT 41.1 11/03/2011   MCV 92.6 11/03/2011   PLT 271 11/03/2011      Chemistry      Component Value Date/Time   NA 143 04/19/2011 1008   K 3.8 04/19/2011 1008   CL 102 04/19/2011 1008   CO2 28 04/19/2011 1008   BUN 13 04/19/2011 1008   CREATININE 0.84 04/19/2011 1008      Component Value Date/Time   CALCIUM 9.5 04/19/2011 1008   ALKPHOS 77 04/19/2011 1008   AST 21 04/19/2011 1008   ALT 22 04/19/2011 1008   BILITOT 0.6 04/19/2011 1008       RADIOGRAPHIC STUDIES:  No results found.  ASSESSMENT: 61 year old female with  #1 stage 0 DCIS of the right breast. Patient is status post lumpectomy in October 2011. This was then followed by radiation therapy which she completed in January 2012. She was then begun on chemoprevention with tamoxifen 20 mg daily and every 2012. She continues to tolerate this well.  #2 depression and hot flashes patient is on Effexor and she is tolerating it well and her symptoms have resolved.   PLAN:   #1 we will continue to tamoxifen as well as the Effexor. Prescriptions for both of the medications were sent to her pharmacy electronically.  #2 patient is counseled regarding side effects of tamoxifen. She is encouraged to continue getting her ophthalmologic examinations on a yearly basis. Also to report any vaginal bleeding to [place] or any calf tenderness or pain fullness or swelling.  #3 patient will be seen back in 6 months time.   All questions were answered. The patient knows to call the clinic with any problems, questions or concerns. We can certainly see the patient much sooner if necessary.  I spent 30 minutes counseling the patient face to face. The total time spent  in the appointment was 30 minutes.    Drue Second, MD Medical/Oncology [facility] [phone] (beeper) [phone] (Office)  11/03/2011, 9:16 AM

## 2011-11-03 NOTE — Patient Instructions (Signed)
1. Tamoxifen and Effexor prescriptions were sent to your pharmacy  2. I will continue to see you every 6 months  3. Call with any problems or questions

## 2011-11-03 NOTE — Telephone Encounter (Signed)
Last fill was Effexor ER. Did MD intend on this form again? Yes, per Dr. Welton Flakes.

## 2011-11-03 NOTE — Telephone Encounter (Signed)
gave patient appointments for 05-09-2012 starting at 8:30am printed out calendar and gave to the patient

## 2011-11-14 ENCOUNTER — Other Ambulatory Visit: Payer: Self-pay | Admitting: Oncology

## 2011-12-06 ENCOUNTER — Encounter: Payer: Self-pay | Admitting: Internal Medicine

## 2011-12-06 ENCOUNTER — Ambulatory Visit (INDEPENDENT_AMBULATORY_CARE_PROVIDER_SITE_OTHER): Payer: BC Managed Care – PPO | Admitting: Internal Medicine

## 2011-12-06 VITALS — BP 142/85 | HR 105 | Temp 97.6°F | Resp 16 | Ht 63.0 in | Wt 191.0 lb

## 2011-12-06 DIAGNOSIS — M899 Disorder of bone, unspecified: Secondary | ICD-10-CM

## 2011-12-06 DIAGNOSIS — E559 Vitamin D deficiency, unspecified: Secondary | ICD-10-CM | POA: Insufficient documentation

## 2011-12-06 DIAGNOSIS — J309 Allergic rhinitis, unspecified: Secondary | ICD-10-CM

## 2011-12-06 DIAGNOSIS — K219 Gastro-esophageal reflux disease without esophagitis: Secondary | ICD-10-CM

## 2011-12-06 DIAGNOSIS — Z8659 Personal history of other mental and behavioral disorders: Secondary | ICD-10-CM

## 2011-12-06 DIAGNOSIS — D0590 Unspecified type of carcinoma in situ of unspecified breast: Secondary | ICD-10-CM

## 2011-12-06 DIAGNOSIS — Z800 Family history of malignant neoplasm of digestive organs: Secondary | ICD-10-CM

## 2011-12-06 DIAGNOSIS — M79643 Pain in unspecified hand: Secondary | ICD-10-CM

## 2011-12-06 DIAGNOSIS — E785 Hyperlipidemia, unspecified: Secondary | ICD-10-CM

## 2011-12-06 DIAGNOSIS — Z90710 Acquired absence of both cervix and uterus: Secondary | ICD-10-CM

## 2011-12-06 DIAGNOSIS — D0510 Intraductal carcinoma in situ of unspecified breast: Secondary | ICD-10-CM

## 2011-12-06 DIAGNOSIS — M8580 Other specified disorders of bone density and structure, unspecified site: Secondary | ICD-10-CM | POA: Insufficient documentation

## 2011-12-06 DIAGNOSIS — Z780 Asymptomatic menopausal state: Secondary | ICD-10-CM

## 2011-12-06 DIAGNOSIS — I10 Essential (primary) hypertension: Secondary | ICD-10-CM

## 2011-12-06 DIAGNOSIS — M79609 Pain in unspecified limb: Secondary | ICD-10-CM

## 2011-12-06 DIAGNOSIS — M949 Disorder of cartilage, unspecified: Secondary | ICD-10-CM

## 2011-12-06 MED ORDER — IBUPROFEN 800 MG PO TABS: 800.0000 mg | ORAL_TABLET | Freq: Three times a day (TID) | ORAL | Status: AC | PRN

## 2011-12-06 NOTE — Patient Instructions (Signed)
Will refer to hand surgeon 

## 2011-12-06 NOTE — Progress Notes (Signed)
Subjective:    Patient ID: [name], female    DOB: 1950-08-27, 61 y.o.   MRN: [id number]  HPI  [name] is here for acute visit.  She has been having bilateral hand pain for several weeks now.  She has had carpal tunnel surgery on her R wrist.  She describes numbness and tingling in 4th and 5th digit of L hand and numbness in all fingers of R hand.  No change in color of hands.  States it hurts to use wrist splints  Numbess radiates to mid forearm but not higher  No Known Allergies Past Medical History  Diagnosis Date  . Menopause   . Depression   . 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 Hyperlipidemia   . Hypertension   . GERD (gastroesophageal reflux disease)   . Allergic rhinitis   . Cancer    Past Surgical History  Procedure Date  . Cholecystectomy 08/2005  . Oophorectomy 1993    one side  . Anterior cervical decomp/discectomy fusion 1999  . Abdominal hysterectomy 05/1993  . Breast lumpectomy   . Breast reduction surgery 03/24/11    bilateral    History   Social History  . Marital Status: Married    Spouse Name: Tammy Sours    Number of Children: 0  . Years of Education: 2y clg   Occupational History  . [employer]    Social History Main Topics  . Smoking status: Former Smoker -- 1.0 packs/day for 30 years    Quit date: 11/01/2008  . Smokeless tobacco: Never Used  . Alcohol Use: No  . Drug Use: No  . Sexually Active: Not Currently -- Female partner(s)   Other Topics Concern  . Not on file   Social History Narrative  . No narrative on file   Family History  Problem Relation Age of Onset  . Cancer Father     colon   Patient Active Problem List  Diagnosis  . DCIS (ductal carcinoma in situ)-right breast  . Menopause  . History of depression  . Other and unspecified hyperlipidemia  . Essential hypertension, benign  . GERD (gastroesophageal reflux disease)  . Allergic rhinitis  . Family history of colon cancer  . Osteopenia    . Vitamin d deficiency  . S/P hysterectomy   Current Outpatient Prescriptions on File Prior to Visit  Medication Sig Dispense Refill  . atorvastatin (LIPITOR) 40 MG tablet Take 40 mg by mouth daily.        . Calcium Carbonate (CALCIUM 500 PO) Take 1,000 mg by mouth.        . hydrochlorothiazide 25 MG tablet Take 25 mg by mouth daily.        . Multiple Vitamin (MULTIVITAMIN) tablet Take 1 tablet by mouth daily.        . pantoprazole (PROTONIX) 40 MG tablet Take 40 mg by mouth daily.      . tamoxifen (NOLVADEX) 10 MG tablet Take 2 tablets (20 mg total) by mouth daily.  90 tablet  12  . venlafaxine XR (EFFEXOR-XR) 75 MG 24 hr capsule Take 1 capsule (75 mg total) by mouth daily.  30 capsule  12  . vitamin D, CHOLECALCIFEROL, 400 UNITS tablet Take 1,000 Units by mouth daily.       . vitamin E 400 UNIT capsule Take 400 Units by mouth daily.        . Zinc 50 MG CAPS Take 1 capsule by mouth  daily.        . zolpidem (AMBIEN) 5 MG tablet Take 5 mg by mouth at bedtime as needed.        Marland Kitchen DISCONTD: venlafaxine XR (EFFEXOR-XR) 75 MG 24 hr capsule TAKE 1 CAPSULE BY MOUTH EVERY DAY  30 capsule  0      Review of Systems See HPI    Objective:   Physical Exam Physical Exam  Nursing note and vitals reviewed.  Constitutional: She is oriented to person, place, and time. She appears well-developed and well-nourished.  HENT:  Head: Normocephalic and atraumatic.  Cardiovascular: Normal rate and regular rhythm. Exam reveals no gallop and no friction rub.  No murmur heard.  Pulmonary/Chest: Breath sounds normal. She has no wheezes. She has no rales.  Neurological: She is alert and oriented to person, place, and time.  Skin: Skin is warm and dry.  Psychiatric: She has a normal mood and affect. Her behavior is normal.  Hand.  Normal color, good radial pulses.  Reports tingling to minimal touch of fingers       Assessment & Plan:  Bilateral hand pain  Will refer to Dr. Teressa Senter and OK to take Ibuprofen  800 mg in short term with food  Paresthesias  May have tarsal tunnel on left

## 2011-12-11 ENCOUNTER — Ambulatory Visit (INDEPENDENT_AMBULATORY_CARE_PROVIDER_SITE_OTHER): Payer: BC Managed Care – PPO | Admitting: General Surgery

## 2011-12-11 ENCOUNTER — Encounter (INDEPENDENT_AMBULATORY_CARE_PROVIDER_SITE_OTHER): Payer: Self-pay | Admitting: General Surgery

## 2011-12-11 VITALS — BP 132/84 | HR 72 | Temp 96.9°F | Resp 14 | Ht 63.0 in | Wt 191.2 lb

## 2011-12-11 DIAGNOSIS — Z853 Personal history of malignant neoplasm of breast: Secondary | ICD-10-CM

## 2011-12-11 NOTE — Progress Notes (Signed)
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 She had a right breast reconstruction and left breast reduction by Dr. Odis Luster  September 2012.  She denies feeling any masses in her breast. She denies any lymphadenopathy. She had a mammogram back in July 2012 at [facility] which did not show any evidence of malignancy.  PE: Generally, she looks well in no acute distress.  Right breast demonstrates a well-healed upper outer quadrant scar. Periareolar and inferior breast scars are well healed. No masses felt.  Left breast has periareolar and inferior scars present. No masses palpable.  Nodes-no palpable axillary, cervical, or supraclavicular adenopathy.  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    Plan: Return visit in 3-4 months.

## 2011-12-11 NOTE — Patient Instructions (Signed)
Call if you discover any masses in your breasts. 

## 2011-12-22 ENCOUNTER — Other Ambulatory Visit: Payer: Self-pay | Admitting: Oncology

## 2011-12-22 DIAGNOSIS — C50419 Malignant neoplasm of upper-outer quadrant of unspecified female breast: Secondary | ICD-10-CM

## 2012-01-08 ENCOUNTER — Encounter: Payer: Self-pay | Admitting: *Deleted

## 2012-03-06 ENCOUNTER — Other Ambulatory Visit: Payer: Self-pay | Admitting: Internal Medicine

## 2012-03-06 ENCOUNTER — Encounter: Payer: Self-pay | Admitting: Internal Medicine

## 2012-03-06 ENCOUNTER — Ambulatory Visit (INDEPENDENT_AMBULATORY_CARE_PROVIDER_SITE_OTHER): Payer: BC Managed Care – PPO | Admitting: Internal Medicine

## 2012-03-06 VITALS — BP 120/84 | HR 104 | Temp 97.0°F | Resp 18 | Wt 192.1 lb

## 2012-03-06 DIAGNOSIS — M8580 Other specified disorders of bone density and structure, unspecified site: Secondary | ICD-10-CM

## 2012-03-06 DIAGNOSIS — D0510 Intraductal carcinoma in situ of unspecified breast: Secondary | ICD-10-CM

## 2012-03-06 DIAGNOSIS — K219 Gastro-esophageal reflux disease without esophagitis: Secondary | ICD-10-CM

## 2012-03-06 DIAGNOSIS — I10 Essential (primary) hypertension: Secondary | ICD-10-CM

## 2012-03-06 DIAGNOSIS — E559 Vitamin D deficiency, unspecified: Secondary | ICD-10-CM

## 2012-03-06 DIAGNOSIS — E785 Hyperlipidemia, unspecified: Secondary | ICD-10-CM

## 2012-03-06 DIAGNOSIS — J309 Allergic rhinitis, unspecified: Secondary | ICD-10-CM

## 2012-03-06 DIAGNOSIS — Z0000 Encounter for general adult medical examination without abnormal findings: Secondary | ICD-10-CM

## 2012-03-06 DIAGNOSIS — M949 Disorder of cartilage, unspecified: Secondary | ICD-10-CM

## 2012-03-06 DIAGNOSIS — Z23 Encounter for immunization: Secondary | ICD-10-CM

## 2012-03-06 DIAGNOSIS — Z780 Asymptomatic menopausal state: Secondary | ICD-10-CM

## 2012-03-06 DIAGNOSIS — Z8659 Personal history of other mental and behavioral disorders: Secondary | ICD-10-CM

## 2012-03-06 DIAGNOSIS — D0590 Unspecified type of carcinoma in situ of unspecified breast: Secondary | ICD-10-CM

## 2012-03-06 LAB — POCT URINALYSIS DIPSTICK
Bilirubin, UA: NEGATIVE
Blood, UA: NEGATIVE
Ketones, UA: NEGATIVE
Leukocytes, UA: NEGATIVE
Protein, UA: NEGATIVE
Spec Grav, UA: <=1.005
pH, UA: 6.5

## 2012-03-06 NOTE — Progress Notes (Signed)
Subjective:    Patient ID: [name], female    DOB: 03/13/1951, 61 y.o.   MRN: [id number]  HPI [name] is here for comprehensive eval.  Overall doing well.  Had breast reconstruction surgery and is very happy with this.    Allergies mild this fall  On  Liptor  Will have fasting lipids done this fall at work  It has been over 5 years for TDap.  Mammogram UTD  Effexor helping with depression and hot flushes.  Flushes very mild and less frequent  No Known Allergies Past Medical History  Diagnosis Date  . Menopause   . Depression   . 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 Hyperlipidemia   . Hypertension   . GERD (gastroesophageal reflux disease)   . Allergic rhinitis   . Cancer    Past Surgical History  Procedure Date  . Cholecystectomy 08/2005  . Oophorectomy 1993    one side  . Anterior cervical decomp/discectomy fusion 1999  . Abdominal hysterectomy 05/1993  . Breast reduction surgery 03/24/11    bilateral   . Breast surgery    History   Social History  . Marital Status: Married    Spouse Name: [name]    Number of Children: 0  . Years of Education: 2y clg   Occupational History  . [employer]    Social History Main Topics  . Smoking status: Former Smoker -- 1.0 packs/day for 30 years    Quit date: 11/01/2008  . Smokeless tobacco: Never Used  . Alcohol Use: No  . Drug Use: No  . Sexually Active: Not Currently -- Female partner(s)   Other Topics Concern  . Not on file   Social History Narrative  . No narrative on file   Family History  Problem Relation Age of Onset  . Cancer Father     colon   Patient Active Problem List  Diagnosis  . DCIS (ductal carcinoma in situ)-right breast  . Menopause  . History of depression  . Other and unspecified hyperlipidemia  . Essential hypertension, benign  . GERD (gastroesophageal reflux disease)  . Allergic rhinitis  . Family history of colon cancer  . Osteopenia  . Vitamin d  deficiency  . S/P hysterectomy   Current Outpatient Prescriptions on File Prior to Visit  Medication Sig Dispense Refill  . atorvastatin (LIPITOR) 40 MG tablet Take 40 mg by mouth daily.        . Calcium Carbonate (CALCIUM 500 PO) Take 1,000 mg by mouth.        . hydrochlorothiazide 25 MG tablet Take 25 mg by mouth daily.        . Multiple Vitamin (MULTIVITAMIN) tablet Take 1 tablet by mouth daily.        . pantoprazole (PROTONIX) 40 MG tablet Take 40 mg by mouth daily.      . tamoxifen (NOLVADEX) 10 MG tablet Take 2 tablets (20 mg total) by mouth daily.  90 tablet  12  . venlafaxine XR (EFFEXOR-XR) 75 MG 24 hr capsule Take 1 capsule (75 mg total) by mouth daily.  30 capsule  12  . vitamin D, CHOLECALCIFEROL, 400 UNITS tablet Take 1,000 Units by mouth daily.       . vitamin E 400 UNIT capsule Take 400 Units by mouth daily.        Marland Kitchen zolpidem (AMBIEN) 5 MG tablet Take 5 mg by mouth at bedtime as needed.        Marland Kitchen  venlafaxine XR (EFFEXOR-XR) 75 MG 24 hr capsule TAKE 1 CAPSULE BY MOUTH EVERY DAY  30 capsule  3  . Zinc 50 MG CAPS Take 1 capsule by mouth daily.             Review of Systems See HPI    Objective:   Physical Exam  Physical Exam  Nursing note and vitals reviewed.  Constitutional: She is oriented to person, place, and time. She appears well-developed and well-nourished.  HENT:  Head: Normocephalic and atraumatic.  Right Ear: Tympanic membrane and ear canal normal. No drainage. Tympanic membrane is not injected and not erythematous.  Left Ear: Tympanic membrane and ear canal normal. No drainage. Tympanic membrane is not injected and not erythematous.  Nose: Nose normal. Right sinus exhibits no maxillary sinus tenderness and no frontal sinus tenderness. Left sinus exhibits no maxillary sinus tenderness and no frontal sinus tenderness.  Mouth/Throat: Oropharynx is clear and moist. No oral lesions. No oropharyngeal exudate.  Eyes: Conjunctivae and EOM are normal. Pupils are  equal, round, and reactive to light.  Neck: Normal range of motion. Neck supple. No JVD present. Carotid bruit is not present. No mass and no thyromegaly present.  Cardiovascular: Normal rate, regular rhythm, S1 normal, S2 normal and intact distal pulses. Exam reveals no gallop and no friction rub.  No murmur heard.  Pulses:  Carotid pulses are 2+ on the right side, and 2+ on the left side.  Dorsalis pedis pulses are 2+ on the right side, and 2+ on the left side.  No carotid bruit. No LE edema  Pulmonary/Chest: Breath sounds normal. She has no wheezes. She has no rales. She exhibits no tenderness.  Breast well healed reconstruction scars.  No discrete masses no nipple discharge no axillary adenopathy.   Abdominal: Soft. Bowel sounds are normal. She exhibits no distension and no mass. There is no hepatosplenomegaly. There is no tenderness. There is no CVA tenderness.  Rectal no mass guaiac neg Musculoskeletal: Normal range of motion.  No active synovitis to joints.  Lymphadenopathy:  She has no cervical adenopathy.  She has no axillary adenopathy.  Right: No inguinal and no supraclavicular adenopathy present.  Left: No inguinal and no supraclavicular adenopathy present.  Neurological: She is alert and oriented to person, place, and time. She has normal strength and normal reflexes. She displays no tremor. No cranial nerve deficit or sensory deficit. Coordination and gait normal.  Skin: Skin is warm and dry. No rash noted. No cyanosis. Nails show no clubbing.  Psychiatric: She has a normal mood and affect. Her speech is normal and behavior is normal. Cognition and memory are normal.          Assessment & Plan:  HM:  Will give Tdap today. UTD with mm.   Rx given for Zostavax.  Will get influenza at work along with fasting lipids  Hyperlipidemia  Continue Lipitor  DCIS neg mm in July  Allergic rhinitisl  Continue OTC meds  FH colon ca  Dr. Madilyn Fireman manages.    q5 year  intervals  Osteopenia will schedule bone density at [facility]  GERD continue PPI

## 2012-03-06 NOTE — Patient Instructions (Addendum)
See me as needed 

## 2012-03-07 ENCOUNTER — Other Ambulatory Visit: Payer: Self-pay | Admitting: Neurology

## 2012-03-07 DIAGNOSIS — R200 Anesthesia of skin: Secondary | ICD-10-CM

## 2012-03-13 ENCOUNTER — Telehealth: Payer: Self-pay | Admitting: *Deleted

## 2012-03-13 ENCOUNTER — Ambulatory Visit
Admission: RE | Admit: 2012-03-13 | Discharge: 2012-03-13 | Disposition: A | Discharge status: Home / Self Care | Payer: BC Managed Care – PPO | Source: Ambulatory Visit | Attending: Internal Medicine | Admitting: Internal Medicine

## 2012-03-13 DIAGNOSIS — M8580 Other specified disorders of bone density and structure, unspecified site: Secondary | ICD-10-CM

## 2012-03-13 DIAGNOSIS — Z780 Asymptomatic menopausal state: Secondary | ICD-10-CM

## 2012-03-13 NOTE — Telephone Encounter (Signed)
Received call from [facility] regarding pt bone density order. Order was placed again and fax sent

## 2012-03-14 ENCOUNTER — Encounter: Payer: Self-pay | Admitting: *Deleted

## 2012-03-15 ENCOUNTER — Telehealth: Payer: Self-pay | Admitting: *Deleted

## 2012-03-15 NOTE — Telephone Encounter (Signed)
Message left for pt regarding bone density results

## 2012-03-16 ENCOUNTER — Other Ambulatory Visit: Payer: BC Managed Care – PPO

## 2012-03-23 ENCOUNTER — Ambulatory Visit
Admission: RE | Admit: 2012-03-23 | Discharge: 2012-03-23 | Disposition: A | Discharge status: Home / Self Care | Payer: BC Managed Care – PPO | Source: Ambulatory Visit | Attending: Neurology | Admitting: Neurology

## 2012-03-23 DIAGNOSIS — R200 Anesthesia of skin: Secondary | ICD-10-CM

## 2012-04-20 ENCOUNTER — Other Ambulatory Visit: Payer: Self-pay | Admitting: Oncology

## 2012-04-22 ENCOUNTER — Other Ambulatory Visit: Payer: Self-pay | Admitting: *Deleted

## 2012-05-09 ENCOUNTER — Other Ambulatory Visit: Payer: BC Managed Care – PPO | Admitting: Lab

## 2012-05-09 ENCOUNTER — Ambulatory Visit: Payer: BC Managed Care – PPO | Admitting: Oncology

## 2012-05-12 ENCOUNTER — Other Ambulatory Visit: Payer: Self-pay | Admitting: Oncology

## 2012-05-15 ENCOUNTER — Encounter: Payer: Self-pay | Admitting: *Deleted

## 2012-06-13 ENCOUNTER — Other Ambulatory Visit: Payer: Self-pay | Admitting: Oncology

## 2012-06-17 ENCOUNTER — Other Ambulatory Visit: Payer: Self-pay | Admitting: Oncology

## 2012-07-03 ENCOUNTER — Other Ambulatory Visit (HOSPITAL_BASED_OUTPATIENT_CLINIC_OR_DEPARTMENT_OTHER): Payer: BC Managed Care – PPO | Admitting: Lab

## 2012-07-03 ENCOUNTER — Ambulatory Visit (HOSPITAL_BASED_OUTPATIENT_CLINIC_OR_DEPARTMENT_OTHER): Payer: BC Managed Care – PPO | Admitting: Adult Health

## 2012-07-03 ENCOUNTER — Telehealth: Payer: Self-pay | Admitting: Oncology

## 2012-07-03 ENCOUNTER — Encounter: Payer: Self-pay | Admitting: Adult Health

## 2012-07-03 VITALS — BP 120/79 | HR 111 | Temp 97.0°F | Resp 20 | Ht 63.0 in | Wt 197.7 lb

## 2012-07-03 DIAGNOSIS — D0590 Unspecified type of carcinoma in situ of unspecified breast: Secondary | ICD-10-CM

## 2012-07-03 DIAGNOSIS — D0510 Intraductal carcinoma in situ of unspecified breast: Secondary | ICD-10-CM

## 2012-07-03 DIAGNOSIS — C50419 Malignant neoplasm of upper-outer quadrant of unspecified female breast: Secondary | ICD-10-CM

## 2012-07-03 LAB — CBC WITH DIFFERENTIAL/PLATELET
BASO%: 0.4 % (ref 0.0–2.0)
Eosinophils Absolute: 0.3 10*3/uL (ref 0.0–0.5)
HCT: 39.2 % (ref 34.8–46.6)
LYMPH%: 28.9 % (ref 14.0–49.7)
MCHC: 34 g/dL (ref 31.5–36.0)
MONO#: 0.9 10*3/uL (ref 0.1–0.9)
NEUT#: 5.8 10*3/uL (ref 1.5–6.5)
Platelets: 276 10*3/uL (ref 145–400)
RBC: 4.33 10*6/uL (ref 3.70–5.45)
WBC: 9.7 10*3/uL (ref 3.9–10.3)
lymph#: 2.8 10*3/uL (ref 0.9–3.3)

## 2012-07-03 LAB — COMPREHENSIVE METABOLIC PANEL (CC13)
ALT: 24 U/L (ref 0–55)
Albumin: 3.7 g/dL (ref 3.5–5.0)
CO2: 30 mEq/L — ABNORMAL HIGH (ref 22–29)
Calcium: 9.8 mg/dL (ref 8.4–10.4)
Chloride: 99 mEq/L (ref 98–107)
Glucose: 112 mg/dl — ABNORMAL HIGH (ref 70–99)
Sodium: 141 mEq/L (ref 136–145)
Total Bilirubin: 0.91 mg/dL (ref 0.20–1.20)
Total Protein: 7.2 g/dL (ref 6.4–8.3)

## 2012-07-03 MED ORDER — VENLAFAXINE HCL ER 75 MG PO CP24: 75.0000 mg | ORAL_CAPSULE | Freq: Every day | ORAL | Status: DC

## 2012-07-03 MED ORDER — TAMOXIFEN CITRATE 20 MG PO TABS: 20.0000 mg | ORAL_TABLET | Freq: Every day | ORAL | Status: DC

## 2012-07-03 NOTE — Telephone Encounter (Signed)
gv  Pt appt schedule for July.

## 2012-07-03 NOTE — Progress Notes (Signed)
OFFICE PROGRESS NOTE  CC  [name],[name], MD [address] [place] [address] Dr. Avel Peace  Rema Fendt FNP Dr. 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  PRIOR THERAPY:  #1 patient underwent a lumpectomy on 04/22/2010 for a DCIS of the right breast.  #2 she then received radiation therapy to the right breast completing on [name] 27 2012.  #3 she was begun on tamoxifen 20 mg daily starting in February 2012. A total of 5 years of therapy is planned.  #4 patient does experience night sweats and hot flashes due to tamoxifen. She is currently on Effexor 75 mg daily for this. She also has depression so this is a secondary gain.  CURRENT THERAPY: Tamoxifen 20 mg daily with Effexor 75 mg daily  INTERVAL HISTORY: [name] 62 y.o. female returns for Followup visit today. She continues to tolerate her Tamoxifen well, as long as she's taking the Effexor.  She's got essentially no complaints and a 10 point ROS is neg.    MEDICAL HISTORY: Past Medical History  Diagnosis Date  . Menopause   . Depression   . 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 Hyperlipidemia   . Hypertension   . GERD (gastroesophageal reflux disease)   . Allergic rhinitis   . Cancer     ALLERGIES:   has no known allergies.  MEDICATIONS:  Current Outpatient Prescriptions  Medication Sig Dispense Refill  . atorvastatin (LIPITOR) 40 MG tablet Take 40 mg by mouth daily.        . Calcium Carbonate (CALCIUM 500 PO) Take 1,000 mg by mouth.        . hydrochlorothiazide 25 MG tablet Take 25 mg by mouth daily.        . Multiple Vitamin (MULTIVITAMIN) tablet Take 1 tablet by mouth daily.        . pantoprazole (PROTONIX) 40 MG tablet Take 40 mg by mouth daily.      . tamoxifen (NOLVADEX) 20 MG tablet TAKE 1 TABLET BY MOUTH EVERY DAY  30 tablet  0  . tamoxifen (NOLVADEX) 20 MG tablet Take 1 tablet (20 mg total)  by mouth daily.  90 tablet  12  . venlafaxine XR (EFFEXOR-XR) 75 MG 24 hr capsule Take 1 capsule (75 mg total) by mouth daily.  90 capsule  12  . vitamin D, CHOLECALCIFEROL, 400 UNITS tablet Take 1,000 Units by mouth daily.       . vitamin E 400 UNIT capsule Take 400 Units by mouth daily.        . Zinc 50 MG CAPS Take 1 capsule by mouth daily.        Marland Kitchen zolpidem (AMBIEN) 5 MG tablet Take 5 mg by mouth at bedtime as needed.          SURGICAL HISTORY:  Past Surgical History  Procedure Date  . Cholecystectomy 08/2005  . Oophorectomy 1993    one side  . Anterior cervical decomp/discectomy fusion 1999  . Abdominal hysterectomy 05/1993  . Breast reduction surgery 03/24/11    bilateral   . Breast surgery     REVIEW OF SYSTEMS:   General: fatigue (-), night sweats (-), fever (-), pain (-) Lymph: palpable nodes (-) HEENT: vision changes (-), mucositis (-), gum bleeding (-), epistaxis (-) Cardiovascular: chest pain (-), palpitations (-) Pulmonary: shortness of breath (-), dyspnea on exertion (-), cough (-), hemoptysis (-)  GI:  Early satiety (-), melena (-), dysphagia (-), nausea/vomiting (-), diarrhea (-) GU: dysuria (-), hematuria (-), incontinence (-) Musculoskeletal: joint swelling (-), joint pain (-), back pain (-) Neuro: weakness (-), numbness (-), headache (-), confusion (-) Skin: Rash (-), lesions (-), dryness (-) Psych: depression (-), suicidal/homicidal ideation (-), feeling of hopelessness (-)   Health Maintenance  /Mammogram: 12/2011 Colonoscopy:q 5 years, 2013 Bone Density Scan: 01/2012 Pap Smear: s/p TAH/BSO Eye Exam: 06/2011 Vitamin D Level: 2 years ago, low. Lipid Panel: 03/2012   PHYSICAL EXAMINATION: BP 120/79  Pulse 111  Temp 97 F (36.1 C)  Resp 20  Ht 5\' 3"  (1.6 m)  Wt 197 lb 11.2 oz (89.676 kg)  BMI 35.02 kg/m2 General: Patient is a well appearing female in no acute distress HEENT: PERRLA, sclerae anicteric no conjunctival pallor, MMM Neck: supple, no  palpable adenopathy Lungs: clear to auscultation bilaterally, no wheezes, rhonchi, or rales Cardiovascular: regular rate rhythm, S1, S2, no murmurs, rubs or gallops Abdomen: Soft, non-tender, non-distended, normoactive bowel sounds, no HSM Extremities: warm and well perfused, no clubbing, cyanosis, or edema Skin: No rashes or lesions Neuro: Non-focal Bilateral breast examination right breast reveals well-healed surgical scar there are no masses no nodularities or nipple discharge. Left breast surgical scars are healed no masses or nipple discharge. ECOG PERFORMANCE STATUS: 0 - Asymptomatic    LABORATORY DATA: Lab Results  Component Value Date   WBC 9.7 07/03/2012   HGB 13.3 07/03/2012   HCT 39.2 07/03/2012   MCV 90.7 07/03/2012   PLT 276 07/03/2012      Chemistry      Component Value Date/Time   NA 141 07/03/2012 1525   NA 142 11/03/2011 0852   K 3.1* 07/03/2012 1525   K 3.6 11/03/2011 0852   CL 99 07/03/2012 1525   CL 104 11/03/2011 0852   CO2 30* 07/03/2012 1525   CO2 30 11/03/2011 0852   BUN 17.0 07/03/2012 1525   BUN 23 11/03/2011 0852   CREATININE 0.8 07/03/2012 1525   CREATININE 0.84 11/03/2011 0852      Component Value Date/Time   CALCIUM 9.8 07/03/2012 1525   CALCIUM 9.4 11/03/2011 0852   ALKPHOS 79 07/03/2012 1525   ALKPHOS 61 11/03/2011 0852   AST 26 07/03/2012 1525   AST 28 11/03/2011 0852   ALT 24 07/03/2012 1525   ALT 24 11/03/2011 0852   BILITOT 0.91 07/03/2012 1525   BILITOT 0.5 11/03/2011 0852       RADIOGRAPHIC STUDIES:  No results found.  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 Patient is status post lumpectomy in October 2011. This was then followed by radiation therapy which she completed in January 2012. She was then begun on chemoprevention with tamoxifen 20 mg daily and every 2012. She continues to tolerate this well.  #2 depression and hot flashes patient is on Effexor and she is tolerating it well and her symptoms have resolved.   PLAN:     #1 Ms. Cianci is doing well and has no sign of recurrence.  She will continue to tamoxifen as well as the Effexor. Prescriptions for both of the medications were sent to her pharmacy electronically.  #2 patient is counseled regarding side effects of tamoxifen. She is encouraged to continue getting her ophthalmologic examinations on a yearly basis. Also to report any vaginal bleeding to [place] or any calf tenderness or pain fullness or swelling.  #3 patient will be seen back in 6  months time.   All questions were answered. The patient knows to call the clinic with any problems, questions or concerns. We can certainly see the patient much sooner if necessary.  I spent 320 minutes counseling the patient face to face. The total time spent in the appointment was 30 minutes.    Cherie Ouch Lyn Hollingshead, NP Medical Oncology [facility] Phone: [phone]  07/03/2012, 4:08 PM

## 2012-07-03 NOTE — Patient Instructions (Signed)
Doing well.  No sign of recurrence.  We will see you back in 6 months.   

## 2012-07-05 ENCOUNTER — Other Ambulatory Visit: Payer: Self-pay | Admitting: *Deleted

## 2012-07-05 MED ORDER — POTASSIUM CHLORIDE CRYS ER 20 MEQ PO TBCR: 20.0000 meq | EXTENDED_RELEASE_TABLET | Freq: Every day | ORAL | Status: DC

## 2012-07-05 NOTE — Telephone Encounter (Signed)
Per NP, notified pt to begin K-Dur 1 PO Daily x 7 days. Rx sent to pt pharmacy

## 2012-07-05 NOTE — Telephone Encounter (Signed)
Message copied by GARNER, Gerald Leitz on Fri Jul 05, 2012  8:57 AM ------      Message from: Laural Golden      Created: Fri Jul 05, 2012  8:36 AM       Please give pt, K dur po daily x 7 days, disp 7.            Thanks,      L      ----- Message -----         From: [facility]         Sent: 07/03/2012   3:34 PM           To: Victorino December, MD

## 2012-08-01 ENCOUNTER — Telehealth: Payer: Self-pay | Admitting: Medical Oncology

## 2012-08-01 NOTE — Telephone Encounter (Signed)
Pt called stating that she didn't realize she had a prescription for potassium. Per NP, informed patient will address at next appt and since potassium level @ 3.1 patient not to worry about filling prescription. Patient expressed verbal understanding, no further questions at this time.

## 2013-01-01 ENCOUNTER — Other Ambulatory Visit: Payer: Self-pay | Admitting: Medical Oncology

## 2013-01-01 DIAGNOSIS — D0511 Intraductal carcinoma in situ of right breast: Secondary | ICD-10-CM

## 2013-01-02 ENCOUNTER — Other Ambulatory Visit (HOSPITAL_BASED_OUTPATIENT_CLINIC_OR_DEPARTMENT_OTHER): Payer: BC Managed Care – PPO | Admitting: Lab

## 2013-01-02 ENCOUNTER — Telehealth: Payer: Self-pay | Admitting: *Deleted

## 2013-01-02 ENCOUNTER — Ambulatory Visit (HOSPITAL_BASED_OUTPATIENT_CLINIC_OR_DEPARTMENT_OTHER): Payer: BC Managed Care – PPO | Admitting: Oncology

## 2013-01-02 ENCOUNTER — Encounter: Payer: Self-pay | Admitting: Oncology

## 2013-01-02 ENCOUNTER — Other Ambulatory Visit: Payer: BC Managed Care – PPO | Admitting: Lab

## 2013-01-02 ENCOUNTER — Ambulatory Visit: Payer: BC Managed Care – PPO | Admitting: Oncology

## 2013-01-02 DIAGNOSIS — D0590 Unspecified type of carcinoma in situ of unspecified breast: Secondary | ICD-10-CM

## 2013-01-02 DIAGNOSIS — D0511 Intraductal carcinoma in situ of right breast: Secondary | ICD-10-CM

## 2013-01-02 LAB — COMPREHENSIVE METABOLIC PANEL (CC13)
Albumin: 3.4 g/dL — ABNORMAL LOW (ref 3.5–5.0)
Alkaline Phosphatase: 65 U/L (ref 40–150)
BUN: 15.8 mg/dL (ref 7.0–26.0)
CO2: 27 mEq/L (ref 22–29)
Glucose: 107 mg/dl (ref 70–140)
Sodium: 142 mEq/L (ref 136–145)
Total Bilirubin: 0.55 mg/dL (ref 0.20–1.20)
Total Protein: 6.7 g/dL (ref 6.4–8.3)

## 2013-01-02 LAB — CBC WITH DIFFERENTIAL/PLATELET
Basophils Absolute: 0.1 10*3/uL (ref 0.0–0.1)
Eosinophils Absolute: 0.2 10*3/uL (ref 0.0–0.5)
HCT: 39.9 % (ref 34.8–46.6)
HGB: 13.6 g/dL (ref 11.6–15.9)
LYMPH%: 27 % (ref 14.0–49.7)
MCV: 92.8 fL (ref 79.5–101.0)
MONO#: 0.8 10*3/uL (ref 0.1–0.9)
MONO%: 9.1 % (ref 0.0–14.0)
NEUT#: 5.4 10*3/uL (ref 1.5–6.5)
Platelets: 292 10*3/uL (ref 145–400)
WBC: 9 10*3/uL (ref 3.9–10.3)

## 2013-01-02 MED ORDER — GABAPENTIN 100 MG PO CAPS: 100.0000 mg | ORAL_CAPSULE | Freq: Three times a day (TID) | ORAL | Status: DC

## 2013-01-02 NOTE — Telephone Encounter (Signed)
appts made and printed...td 

## 2013-01-02 NOTE — Patient Instructions (Addendum)
Continue tamoxifen 20 mg daily  Continue effexor 75 mg daily  Begin neurontin 100 mg three times a day for hot flashes and neuropathy

## 2013-01-02 NOTE — Progress Notes (Signed)
OFFICE PROGRESS NOTE  CC  Knight,[name], MD [address] [place] [address] Dr. Avel Knight  Robin Fendt FNP Dr. 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  PRIOR THERAPY:  #1 patient underwent a lumpectomy on 04/22/2010 for a DCIS of the right breast.  #2 she then received radiation therapy to the right breast completing on [name] 2012.  #3 she was begun on tamoxifen 20 mg daily starting in February 2012. A total of 5 years of therapy is planned.  #4 patient does experience night sweats and hot flashes due to tamoxifen. She is currently on Effexor 75 mg daily for this. She also has depression so this is a secondary gain.  CURRENT THERAPY: Tamoxifen 20 mg daily with Effexor 75 mg daily  INTERVAL HISTORY: [name] 62 y.o. female returns for Followup visit today. She continues to tolerate her Tamoxifen well, as long as she's taking the Effexor.  She's got essentially no complaints and a 10 point ROS is neg.    MEDICAL HISTORY: Past Medical History  Diagnosis Date  . Menopause   . Depression   . 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 Hyperlipidemia   . Hypertension   . GERD (gastroesophageal reflux disease)   . Allergic rhinitis   . Cancer     ALLERGIES:  has No Known Allergies.  MEDICATIONS:  Current Outpatient Prescriptions  Medication Sig Dispense Refill  . atorvastatin (LIPITOR) 40 MG tablet Take 40 mg by mouth daily.        . Calcium Carbonate (CALCIUM 500 PO) Take 1,000 mg by mouth.        . hydrochlorothiazide 25 MG tablet Take 25 mg by mouth daily.        . Multiple Vitamin (MULTIVITAMIN) tablet Take 1 tablet by mouth daily.        . pantoprazole (PROTONIX) 40 MG tablet Take 40 mg by mouth daily.      Marland Kitchen PRESCRIPTION MEDICATION Inject as directed once a week. B-12 injections      . tamoxifen (NOLVADEX) 20 MG tablet Take 1 tablet (20 mg total) by  mouth daily.  90 tablet  12  . venlafaxine XR (EFFEXOR-XR) 75 MG 24 hr capsule Take 1 capsule (75 mg total) by mouth daily.  90 capsule  12  . vitamin D, CHOLECALCIFEROL, 400 UNITS tablet Take 1,000 Units by mouth daily.       . vitamin E 400 UNIT capsule Take 400 Units by mouth daily.        . Zinc 50 MG CAPS Take 1 capsule by mouth daily.        Marland Kitchen zolpidem (AMBIEN) 5 MG tablet Take 5 mg by mouth at bedtime as needed.        . potassium chloride SA (K-DUR,KLOR-CON) 20 MEQ tablet Take 1 tablet (20 mEq total) by mouth daily.  7 tablet  0  . tamoxifen (NOLVADEX) 20 MG tablet TAKE 1 TABLET BY MOUTH EVERY DAY  30 tablet  0   No current facility-administered medications for this visit.    SURGICAL HISTORY:  Past Surgical History  Procedure Laterality Date  . Cholecystectomy  08/2005  . Oophorectomy  1993    one side  . Anterior cervical decomp/discectomy fusion  1999  . Abdominal hysterectomy  05/1993  . Breast reduction surgery  03/24/11    bilateral   . Breast surgery  REVIEW OF SYSTEMS:   General: fatigue (-), night sweats (-), fever (-), pain (-) Lymph: palpable nodes (-) HEENT: vision changes (-), mucositis (-), gum bleeding (-), epistaxis (-) Cardiovascular: chest pain (-), palpitations (-) Pulmonary: shortness of breath (-), dyspnea on exertion (-), cough (-), hemoptysis (-) GI:  Early satiety (-), melena (-), dysphagia (-), nausea/vomiting (-), diarrhea (-) GU: dysuria (-), hematuria (-), incontinence (-) Musculoskeletal: joint swelling (-), joint pain (-), back pain (-) Neuro: weakness (-), numbness (-), headache (-), confusion (-) Skin: Rash (-), lesions (-), dryness (-) Psych: depression (-), suicidal/homicidal ideation (-), feeling of hopelessness (-)   Health Maintenance  /Mammogram: 12/2011 Colonoscopy:q 5 years, 2013 Bone Density Scan: 01/2012 Pap Smear: s/p TAH/BSO Eye Exam: 06/2011 Vitamin D Level: 2 years ago, low. Lipid Panel: 03/2012   PHYSICAL  EXAMINATION: There were no vitals taken for this visit. General: Patient is a well appearing female in no acute distress HEENT: PERRLA, sclerae anicteric no conjunctival pallor, MMM Neck: supple, no palpable adenopathy Lungs: clear to auscultation bilaterally, no wheezes, rhonchi, or rales Cardiovascular: regular rate rhythm, S1, S2, no murmurs, rubs or gallops Abdomen: Soft, non-tender, non-distended, normoactive bowel sounds, no HSM Extremities: warm and well perfused, no clubbing, cyanosis, or edema Skin: No rashes or lesions Neuro: Non-focal Bilateral breast examination right breast reveals well-healed surgical scar there are no masses no nodularities or nipple discharge. Left breast surgical scars are healed no masses or nipple discharge. ECOG PERFORMANCE STATUS: 0 - Asymptomatic    LABORATORY DATA: Lab Results  Component Value Date   WBC 9.0 01/02/2013   HGB 13.6 01/02/2013   HCT 39.9 01/02/2013   MCV 92.8 01/02/2013   PLT 292 01/02/2013      Chemistry      Component Value Date/Time   NA 141 07/03/2012 1525   NA 142 11/03/2011 0852   K 3.1* 07/03/2012 1525   K 3.6 11/03/2011 0852   CL 99 07/03/2012 1525   CL 104 11/03/2011 0852   CO2 30* 07/03/2012 1525   CO2 30 11/03/2011 0852   BUN 17.0 07/03/2012 1525   BUN 23 11/03/2011 0852   CREATININE 0.8 07/03/2012 1525   CREATININE 0.84 11/03/2011 0852      Component Value Date/Time   CALCIUM 9.8 07/03/2012 1525   CALCIUM 9.4 11/03/2011 0852   ALKPHOS 79 07/03/2012 1525   ALKPHOS 61 11/03/2011 0852   AST 26 07/03/2012 1525   AST 28 11/03/2011 0852   ALT 24 07/03/2012 1525   ALT 24 11/03/2011 0852   BILITOT 0.91 07/03/2012 1525   BILITOT 0.5 11/03/2011 0852       RADIOGRAPHIC STUDIES:  No results found.  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 Patient is status post lumpectomy in October 2011. This was then followed by radiation therapy which she completed in January 2012. She was then begun on chemoprevention  with tamoxifen 20 mg daily and every 2012. She continues to tolerate this well.  #2 depression and hot flashes patient is on Effexor and she is tolerating it well and her symptoms have resolved.   PLAN:  Continue tamoxifen 20 mg daily  Continue effexor 75 mg daily  Begin neurontin 100 mg three times a day for hot flashes and neuropathy   All questions were answered. The patient knows to call the clinic with any problems, questions or concerns. We can certainly see the patient much sooner if necessary.  I spent 20 minutes counseling the  patient face to face. The total time spent in the appointment was 30 minutes.   Drue Second, MD Medical/Oncology [facility] [phone] (beeper) [phone] (Office)  01/02/2013, 10:Knight AM

## 2013-01-03 ENCOUNTER — Other Ambulatory Visit: Payer: Self-pay | Admitting: Medical Oncology

## 2013-01-03 MED ORDER — TAMOXIFEN CITRATE 20 MG PO TABS: 20.0000 mg | ORAL_TABLET | Freq: Every day | ORAL | Status: DC

## 2013-01-08 ENCOUNTER — Other Ambulatory Visit: Payer: Self-pay | Admitting: *Deleted

## 2013-01-08 NOTE — Telephone Encounter (Signed)
Refill request for 90 day supply from [facility] to MD for signature.

## 2013-01-09 ENCOUNTER — Other Ambulatory Visit: Payer: Self-pay | Admitting: *Deleted

## 2013-01-09 DIAGNOSIS — D0511 Intraductal carcinoma in situ of right breast: Secondary | ICD-10-CM

## 2013-01-09 MED ORDER — GABAPENTIN 100 MG PO CAPS: 100.0000 mg | ORAL_CAPSULE | Freq: Three times a day (TID) | ORAL | Status: DC

## 2013-01-09 MED ORDER — VENLAFAXINE HCL ER 75 MG PO CP24: 75.0000 mg | ORAL_CAPSULE | Freq: Every day | ORAL | Status: DC

## 2013-01-09 MED ORDER — TAMOXIFEN CITRATE 20 MG PO TABS: 20.0000 mg | ORAL_TABLET | Freq: Every day | ORAL | Status: DC

## 2013-01-15 ENCOUNTER — Encounter (INDEPENDENT_AMBULATORY_CARE_PROVIDER_SITE_OTHER): Payer: Self-pay

## 2013-01-16 ENCOUNTER — Encounter: Payer: Self-pay | Admitting: *Deleted

## 2013-05-01 ENCOUNTER — Other Ambulatory Visit: Payer: Self-pay

## 2013-07-07 ENCOUNTER — Other Ambulatory Visit (HOSPITAL_BASED_OUTPATIENT_CLINIC_OR_DEPARTMENT_OTHER): Payer: BC Managed Care – PPO

## 2013-07-07 ENCOUNTER — Encounter: Payer: Self-pay | Admitting: Oncology

## 2013-07-07 ENCOUNTER — Telehealth: Payer: Self-pay | Admitting: Oncology

## 2013-07-07 ENCOUNTER — Ambulatory Visit (HOSPITAL_BASED_OUTPATIENT_CLINIC_OR_DEPARTMENT_OTHER): Payer: BC Managed Care – PPO | Admitting: Oncology

## 2013-07-07 VITALS — BP 137/83 | HR 103 | Temp 98.1°F | Resp 20 | Ht 63.0 in | Wt 206.2 lb

## 2013-07-07 DIAGNOSIS — F329 Major depressive disorder, single episode, unspecified: Secondary | ICD-10-CM

## 2013-07-07 DIAGNOSIS — F3289 Other specified depressive episodes: Secondary | ICD-10-CM

## 2013-07-07 DIAGNOSIS — N959 Unspecified menopausal and perimenopausal disorder: Secondary | ICD-10-CM

## 2013-07-07 DIAGNOSIS — D0590 Unspecified type of carcinoma in situ of unspecified breast: Secondary | ICD-10-CM

## 2013-07-07 DIAGNOSIS — D0511 Intraductal carcinoma in situ of right breast: Secondary | ICD-10-CM

## 2013-07-07 LAB — CBC WITH DIFFERENTIAL/PLATELET
BASO%: 0.9 % (ref 0.0–2.0)
BASOS ABS: 0.1 10*3/uL (ref 0.0–0.1)
EOS ABS: 0.4 10*3/uL (ref 0.0–0.5)
EOS%: 3.8 % (ref 0.0–7.0)
HCT: 39.7 % (ref 34.8–46.6)
HEMOGLOBIN: 13.3 g/dL (ref 11.6–15.9)
LYMPH#: 2.5 10*3/uL (ref 0.9–3.3)
LYMPH%: 24.9 % (ref 14.0–49.7)
MCH: 31.2 pg (ref 25.1–34.0)
MCHC: 33.5 g/dL (ref 31.5–36.0)
MCV: 93.2 fL (ref 79.5–101.0)
MONO#: 1 10*3/uL — ABNORMAL HIGH (ref 0.1–0.9)
MONO%: 9.6 % (ref 0.0–14.0)
NEUT%: 60.8 % (ref 38.4–76.8)
NEUTROS ABS: 6 10*3/uL (ref 1.5–6.5)
Platelets: 262 10*3/uL (ref 145–400)
RBC: 4.26 10*6/uL (ref 3.70–5.45)
RDW: 13.3 % (ref 11.2–14.5)
WBC: 9.9 10*3/uL (ref 3.9–10.3)

## 2013-07-07 LAB — COMPREHENSIVE METABOLIC PANEL (CC13)
ALBUMIN: 3.7 g/dL (ref 3.5–5.0)
ALT: 36 U/L (ref 0–55)
ANION GAP: 14 meq/L — AB (ref 3–11)
AST: 33 U/L (ref 5–34)
Alkaline Phosphatase: 64 U/L (ref 40–150)
BUN: 11.3 mg/dL (ref 7.0–26.0)
CALCIUM: 9.6 mg/dL (ref 8.4–10.4)
CHLORIDE: 104 meq/L (ref 98–109)
CO2: 27 meq/L (ref 22–29)
Creatinine: 0.8 mg/dL (ref 0.6–1.1)
GLUCOSE: 117 mg/dL (ref 70–140)
Potassium: 3.4 mEq/L — ABNORMAL LOW (ref 3.5–5.1)
SODIUM: 144 meq/L (ref 136–145)
TOTAL PROTEIN: 7 g/dL (ref 6.4–8.3)
Total Bilirubin: 0.94 mg/dL (ref 0.20–1.20)

## 2013-07-07 MED ORDER — VENLAFAXINE HCL ER 75 MG PO CP24: 75.0000 mg | ORAL_CAPSULE | Freq: Every day | ORAL | Status: DC

## 2013-07-07 NOTE — Progress Notes (Signed)
OFFICE PROGRESS NOTE  CC  Knight,[name], MD [address] [address] [place] [address] Dr. Jackolyn Knight  [place] FNP Dr. Thea Knight   DIAGNOSIS: 63 year old female with DCIS of the right breast.  PRIOR THERAPY:  #1 patient underwent a lumpectomy on 04/22/2010 for a DCIS of the right breast.  #2 she then received radiation therapy to the right breast completing on [name] 27 2012.  #3 she was begun on tamoxifen 20 mg daily starting in February 2012. A total of 5 years of therapy is planned.  #4 patient does experience night sweats and hot flashes due to tamoxifen. She is currently on Effexor 75 mg daily for this. She also has depression so this is a secondary gain.  CURRENT THERAPY: Tamoxifen 20 mg daily with Effexor 75 mg daily  INTERVAL HISTORY: [name] 63 y.o. female returns for Followup visit today. She continues to tolerate her Tamoxifen well, as long as she's taking the Effexor. She had been taking gabapentin for the hot flashes but tells me that it's not helping as well so she is only taking 1 or 2 capsules a day. She is still having some hot flashes. She denies any vaginal bleeding discharge no double vision or blurring vision no fevers chills or night sweats. Her mammograms are up to date. She's got essentially no complaints and a 10 point ROS is neg.    MEDICAL HISTORY: Past Medical History  Diagnosis Date  . Menopause   . Depression   . 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 Hyperlipidemia   . Hypertension   . GERD (gastroesophageal reflux disease)   . Allergic rhinitis   . Cancer     ALLERGIES:  has No Known Allergies.  MEDICATIONS:  Current Outpatient Prescriptions  Medication Sig Dispense Refill  . atorvastatin (LIPITOR) 40 MG tablet Take 40 mg by mouth daily.        . Calcium Carbonate (CALCIUM 500 PO) Take 1,000 mg by mouth.        . gabapentin (NEURONTIN) 100 MG capsule Take 1  capsule (100 mg total) by mouth 3 (three) times daily.  90 capsule  1  . hydrochlorothiazide 25 MG tablet Take 25 mg by mouth daily.        . Multiple Vitamin (MULTIVITAMIN) tablet Take 1 tablet by mouth daily.        . pantoprazole (PROTONIX) 40 MG tablet Take 40 mg by mouth daily.      Marland Kitchen PRESCRIPTION MEDICATION Inject as directed once a week. B-12 injections      . tamoxifen (NOLVADEX) 20 MG tablet Take 1 tablet (20 mg total) by mouth daily.  90 tablet  1  . venlafaxine XR (EFFEXOR-XR) 75 MG 24 hr capsule Take 1 capsule (75 mg total) by mouth daily.  90 capsule  1  . vitamin D, CHOLECALCIFEROL, 400 UNITS tablet Take 1,000 Units by mouth daily.       . vitamin E 400 UNIT capsule Take 400 Units by mouth daily.        . Zinc 50 MG CAPS Take 1 capsule by mouth daily.        Marland Kitchen zolpidem (AMBIEN) 5 MG tablet Take 5 mg by mouth at bedtime as needed.         No current facility-administered medications for this visit.    SURGICAL HISTORY:  Past Surgical History  Procedure Laterality Date  . Cholecystectomy  08/2005  . Oophorectomy  1993    one side  . Anterior cervical decomp/discectomy fusion  1999  . Abdominal hysterectomy  05/1993  . Breast reduction surgery  03/24/11    bilateral   . Breast surgery      REVIEW OF SYSTEMS:   General: fatigue (-), night sweats (-), fever (-), pain (-) Lymph: palpable nodes (-) HEENT: vision changes (-), mucositis (-), gum bleeding (-), epistaxis (-) Cardiovascular: chest pain (-), palpitations (-) Pulmonary: shortness of breath (-), dyspnea on exertion (-), cough (-), hemoptysis (-) GI:  Early satiety (-), melena (-), dysphagia (-), nausea/vomiting (-), diarrhea (-) GU: dysuria (-), hematuria (-), incontinence (-) Musculoskeletal: joint swelling (-), joint pain (-), back pain (-) Neuro: weakness (-), numbness (-), headache (-), confusion (-) Skin: Rash (-), lesions (-), dryness (-) Psych: depression (-), suicidal/homicidal ideation (-), feeling of  hopelessness (-)    PHYSICAL EXAMINATION: BP 137/83  Pulse 103  Temp(Src) 98.1 F (36.7 C) (Oral)  Resp 20  Ht 5\' 3"  (1.6 m)  Wt 206 lb 3.2 oz (93.532 kg)  BMI 36.54 kg/m2 General: Patient is a well appearing female in no acute distress HEENT: PERRLA, sclerae anicteric no conjunctival pallor, MMM Neck: supple, no palpable adenopathy Lungs: clear to auscultation bilaterally, no wheezes, rhonchi, or rales Cardiovascular: regular rate rhythm, S1, S2, no murmurs, rubs or gallops Abdomen: Soft, non-tender, non-distended, normoactive bowel sounds, no HSM Extremities: warm and well perfused, no clubbing, cyanosis, or edema Skin: No rashes or lesions Neuro: Non-focal Bilateral breast examination right breast reveals well-healed surgical scar there are no masses no nodularities or nipple discharge. Left breast surgical scars are healed no masses or nipple discharge. ECOG PERFORMANCE STATUS: 0 - Asymptomatic    LABORATORY DATA: Lab Results  Component Value Date   WBC 9.9 07/07/2013   HGB 13.3 07/07/2013   HCT 39.7 07/07/2013   MCV 93.2 07/07/2013   PLT 262 07/07/2013      Chemistry      Component Value Date/Time   NA 144 07/07/2013 0826   NA 142 11/03/2011 0852   K 3.4* 07/07/2013 0826   K 3.6 11/03/2011 0852   CL 99 07/03/2012 1525   CL 104 11/03/2011 0852   CO2 27 07/07/2013 0826   CO2 30 11/03/2011 0852   BUN 11.3 07/07/2013 0826   BUN 23 11/03/2011 0852   CREATININE 0.8 07/07/2013 0826   CREATININE 0.84 11/03/2011 0852      Component Value Date/Time   CALCIUM 9.6 07/07/2013 0826   CALCIUM 9.4 11/03/2011 0852   ALKPHOS 64 07/07/2013 0826   ALKPHOS 61 11/03/2011 0852   AST 33 07/07/2013 0826   AST 28 11/03/2011 0852   ALT 36 07/07/2013 0826   ALT 24 11/03/2011 0852   BILITOT 0.94 07/07/2013 0826   BILITOT 0.5 11/03/2011 0852       RADIOGRAPHIC STUDIES:  No results found.  ASSESSMENT: 63 year old female with  #1 stage 0 DCIS of the right breast. Patient is status post lumpectomy  in October 2011. This was then followed by radiation therapy which she completed in January 2012. She was then begun on chemoprevention with tamoxifen 20 mg daily and every 2012. She continues to tolerate this well.  #2 depression and hot flashes patient is on Effexor and she is tolerating it well and her symptoms have resolved.   PLAN:  Continue tamoxifen 20 mg daily  Continue effexor 75 mg daily, patient also continue Neurontin and she is taking. Prescription for  Effexor was sent to her pharmacy.  She will be seen back in 6 months time which will be July. At that time she will be due for annual mammogram as well.     All questions were answered. The patient knows to call the clinic with any problems, questions or concerns. We can certainly see the patient much sooner if necessary.  I spent 20 minutes counseling the patient face to face. The total time spent in the appointment was 30 minutes.   Marcy Panning, MD Medical/Oncology [facility] [phone] (beeper) [phone] (Office)  07/07/2013, 9:13 AM

## 2013-07-07 NOTE — Telephone Encounter (Signed)
, °

## 2013-12-31 ENCOUNTER — Telehealth: Payer: Self-pay | Admitting: Hematology and Oncology

## 2013-12-31 NOTE — Telephone Encounter (Signed)
, °

## 2014-01-08 ENCOUNTER — Ambulatory Visit: Payer: BC Managed Care – PPO | Admitting: Oncology

## 2014-01-08 ENCOUNTER — Other Ambulatory Visit: Payer: BC Managed Care – PPO

## 2014-03-18 ENCOUNTER — Encounter: Payer: Self-pay | Admitting: *Deleted

## 2014-04-02 ENCOUNTER — Other Ambulatory Visit: Payer: Self-pay | Admitting: *Deleted

## 2014-04-02 DIAGNOSIS — D0510 Intraductal carcinoma in situ of unspecified breast: Secondary | ICD-10-CM

## 2014-04-03 ENCOUNTER — Other Ambulatory Visit (HOSPITAL_BASED_OUTPATIENT_CLINIC_OR_DEPARTMENT_OTHER): Payer: BC Managed Care – PPO

## 2014-04-03 ENCOUNTER — Ambulatory Visit (HOSPITAL_BASED_OUTPATIENT_CLINIC_OR_DEPARTMENT_OTHER): Payer: BC Managed Care – PPO | Admitting: Hematology and Oncology

## 2014-04-03 ENCOUNTER — Telehealth: Payer: Self-pay | Admitting: Hematology and Oncology

## 2014-04-03 VITALS — BP 135/72 | HR 109 | Temp 98.3°F | Resp 18 | Ht 63.0 in | Wt 207.4 lb

## 2014-04-03 DIAGNOSIS — D0511 Intraductal carcinoma in situ of right breast: Secondary | ICD-10-CM

## 2014-04-03 DIAGNOSIS — D0510 Intraductal carcinoma in situ of unspecified breast: Secondary | ICD-10-CM

## 2014-04-03 DIAGNOSIS — D0512 Intraductal carcinoma in situ of left breast: Secondary | ICD-10-CM

## 2014-04-03 LAB — CBC WITH DIFFERENTIAL/PLATELET
BASO%: 0.7 % (ref 0.0–2.0)
Basophils Absolute: 0.1 10*3/uL (ref 0.0–0.1)
EOS%: 2.1 % (ref 0.0–7.0)
Eosinophils Absolute: 0.3 10*3/uL (ref 0.0–0.5)
HEMATOCRIT: 39.7 % (ref 34.8–46.6)
HGB: 13 g/dL (ref 11.6–15.9)
LYMPH#: 2.4 10*3/uL (ref 0.9–3.3)
LYMPH%: 19.4 % (ref 14.0–49.7)
MCH: 30.2 pg (ref 25.1–34.0)
MCHC: 32.7 g/dL (ref 31.5–36.0)
MCV: 92.2 fL (ref 79.5–101.0)
MONO#: 0.9 10*3/uL (ref 0.1–0.9)
MONO%: 7.5 % (ref 0.0–14.0)
NEUT#: 8.7 10*3/uL — ABNORMAL HIGH (ref 1.5–6.5)
NEUT%: 70.3 % (ref 38.4–76.8)
Platelets: 312 10*3/uL (ref 145–400)
RBC: 4.31 10*6/uL (ref 3.70–5.45)
RDW: 14 % (ref 11.2–14.5)
WBC: 12.5 10*3/uL — AB (ref 3.9–10.3)

## 2014-04-03 LAB — COMPREHENSIVE METABOLIC PANEL (CC13)
ALT: 31 U/L (ref 0–55)
ANION GAP: 10 meq/L (ref 3–11)
AST: 26 U/L (ref 5–34)
Albumin: 3.5 g/dL (ref 3.5–5.0)
Alkaline Phosphatase: 78 U/L (ref 40–150)
BUN: 10.8 mg/dL (ref 7.0–26.0)
CALCIUM: 9.5 mg/dL (ref 8.4–10.4)
CHLORIDE: 103 meq/L (ref 98–109)
CO2: 28 meq/L (ref 22–29)
Creatinine: 0.8 mg/dL (ref 0.6–1.1)
Glucose: 123 mg/dl (ref 70–140)
POTASSIUM: 3.3 meq/L — AB (ref 3.5–5.1)
SODIUM: 142 meq/L (ref 136–145)
TOTAL PROTEIN: 7 g/dL (ref 6.4–8.3)
Total Bilirubin: 0.63 mg/dL (ref 0.20–1.20)

## 2014-04-03 MED ORDER — TAMOXIFEN CITRATE 20 MG PO TABS: 20.0000 mg | ORAL_TABLET | Freq: Every day | ORAL | Status: DC

## 2014-04-03 NOTE — Progress Notes (Signed)
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   PRIOR THERAPY:  #1 patient underwent a lumpectomy on 04/22/2010 for a DCIS of the right breast.  #2 she then received radiation therapy to the right breast completing on [name] 27 2012.  #3 she was begun on tamoxifen 20 mg daily starting in February 2012. A total of 5 years of therapy is planned.  #4 patient does experience night sweats and hot flashes due to tamoxifen. She is currently on Effexor 75 mg daily for this. She also has depression so this is a secondary gain.   CURRENT THERAPY: Tamoxifen 20 mg daily with Effexor 75 mg daily  CHIEF COMPLIANT: Followup of DCIS  INTERVAL HISTORY: [name] is a 63 year old Caucasian lady with above-mentioned history of DCIS involving right breast treated with lumpectomy radiation and is currently on tamoxifen since February 2012. She is tolerating tamoxifen fairly well without any major problems or concerns. She does have hot flashes and night sweats for which she was prescribed Effexor which is helped her somewhat but she continues to have these symptoms. She denies any lumps or nodules. She continues to have annual mammograms.  REVIEW OF SYSTEMS:   Constitutional: Denies fevers, chills or abnormal weight loss Eyes: Denies blurriness of vision Ears, nose, mouth, throat, and face: Denies mucositis or sore throat Respiratory: Denies cough, dyspnea or wheezes Cardiovascular: Denies palpitation, chest discomfort or lower extremity swelling Gastrointestinal:  Denies nausea, heartburn or change in bowel habits Skin: Denies abnormal skin rashes Lymphatics: Denies new lymphadenopathy or easy bruising Neurological:Denies numbness, tingling or new weaknesses Behavioral/Psych: Mood is stable, no new changes  Breast: Intermittent pain in the breast from previous surgery All other systems were reviewed  with the patient and are negative.  I have reviewed the past medical history, past surgical history, social history and family history with the patient and they are unchanged from previous note.  ALLERGIES:  has No Known Allergies.  MEDICATIONS:  Current Outpatient Prescriptions  Medication Sig Dispense Refill  . atorvastatin (LIPITOR) 40 MG tablet Take 40 mg by mouth daily.        . Calcium Carbonate (CALCIUM 500 PO) Take 1,000 mg by mouth.        . hydrochlorothiazide 25 MG tablet Take 25 mg by mouth daily.        . Multiple Vitamin (MULTIVITAMIN) tablet Take 1 tablet by mouth daily.        . pantoprazole (PROTONIX) 40 MG tablet Take 40 mg by mouth daily.      Marland Kitchen PRESCRIPTION MEDICATION Inject as directed once a week. B-12 injections      . tamoxifen (NOLVADEX) 20 MG tablet Take 1 tablet (20 mg total) by mouth daily.  90 tablet  3  . venlafaxine XR (EFFEXOR-XR) 75 MG 24 hr capsule Take 150 mg by mouth daily.      . vitamin D, CHOLECALCIFEROL, 400 UNITS tablet Take 1,000 Units by mouth daily.       . vitamin E 400 UNIT capsule Take 400 Units by mouth daily.        . Zinc 50 MG CAPS Take 1 capsule by mouth daily.        Marland Kitchen zolpidem (AMBIEN) 5 MG tablet Take 5 mg by mouth at bedtime as needed.         No current facility-administered medications for this visit.    PHYSICAL EXAMINATION: ECOG  PERFORMANCE STATUS: 0 - Asymptomatic  Filed Vitals:   04/03/14 1022  BP: 135/72  Pulse: 109  Temp: 98.3 F (36.8 C)  Resp: 18   Filed Weights   04/03/14 1022  Weight: 207 lb 6.4 oz (94.076 kg)    GENERAL:alert, no distress and comfortable SKIN: skin color, texture, turgor are normal, no rashes or significant lesions EYES: normal, Conjunctiva are pink and non-injected, sclera clear OROPHARYNX:no exudate, no erythema and lips, buccal mucosa, and tongue normal  NECK: supple, thyroid normal size, non-tender, without nodularity LYMPH:  no palpable lymphadenopathy in the cervical, axillary or  inguinal LUNGS: clear to auscultation and percussion with normal breathing effort HEART: regular rate & rhythm and no murmurs and no lower extremity edema ABDOMEN:abdomen soft, non-tender and normal bowel sounds Musculoskeletal:no cyanosis of digits and no clubbing  NEURO: alert & oriented x 3 with fluent speech, no focal motor/sensory deficits BREAST: No palpable masses or nodules in either right or left breasts. No palpable axillary supraclavicular or infraclavicular adenopathy. Mild tenderness to deep palpation of the left breast   LABORATORY DATA:  I have reviewed the data as listed   Chemistry      Component Value Date/Time   NA 142 04/03/2014 1007   NA 142 11/03/2011 0852   K 3.3* 04/03/2014 1007   K 3.6 11/03/2011 0852   CL 99 07/03/2012 1525   CL 104 11/03/2011 0852   CO2 28 04/03/2014 1007   CO2 30 11/03/2011 0852   BUN 10.8 04/03/2014 1007   BUN 23 11/03/2011 0852   CREATININE 0.8 04/03/2014 1007   CREATININE 0.84 11/03/2011 0852      Component Value Date/Time   CALCIUM 9.5 04/03/2014 1007   CALCIUM 9.4 11/03/2011 0852   ALKPHOS 78 04/03/2014 1007   ALKPHOS 61 11/03/2011 0852   AST 26 04/03/2014 1007   AST 28 11/03/2011 0852   ALT 31 04/03/2014 1007   ALT 24 11/03/2011 0852   BILITOT 0.63 04/03/2014 1007   BILITOT 0.5 11/03/2011 0852       Lab Results  Component Value Date   WBC 12.5* 04/03/2014   HGB 13.0 04/03/2014   HCT 39.7 04/03/2014   MCV 92.2 04/03/2014   PLT 312 04/03/2014   NEUTROABS 8.7* 04/03/2014     RADIOGRAPHIC STUDIES: I have personally reviewed the radiology reports and agreed with their findings. 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  Tamoxifen toxicity evaluation: Patient experiences hot flashes and was prescribed Effexor which seem to be  helping her but she continues to have some hot flashes intermittently.  Surveillance: Today's breast exam does not reveal any lumps or nodules. I reviewed the mammograms that were done at [facility] which were normal. She will have annual mammograms and followup in 1 year.  Survivorship: Encouraged her to stay more active and do exercise and eat more fruits and vegetables and less red meat.   Orders Placed This Encounter  Procedures  . CBC with Differential    Standing Status: Future     Number of Occurrences:      Standing Expiration Date: 04/03/2015  . Comprehensive metabolic panel (Cmet) - [facility]    Standing Status: Future     Number of Occurrences:      Standing Expiration Date: 04/03/2015   The patient  has a good understanding of the overall plan. she agrees with it. She will call with any problems that may develop before her next visit here.  I spent 20 minutes counseling the patient face to face. The total time spent in the appointment was 25 minutes and more than 50% was on counseling and review of test results    Rulon Eisenmenger, MD 04/03/2014 11:45 AM

## 2014-04-03 NOTE — Assessment & Plan Note (Signed)
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  Tamoxifen toxicity evaluation: Patient experiences hot flashes and was prescribed Effexor which seem to be helping her but she continues to have some hot flashes intermittently.  Surveillance: Today's breast exam does not reveal any lumps or nodules. I reviewed the mammograms that were done at [facility] which were normal. She will have annual mammograms and followup in 1 year.  Survivorship: Encouraged her to stay more active and do exercise and eat more fruits and vegetables and less red meat.

## 2014-04-03 NOTE — Telephone Encounter (Signed)
, °

## 2014-04-06 ENCOUNTER — Encounter: Payer: Self-pay | Admitting: Hematology and Oncology

## 2014-04-27 ENCOUNTER — Encounter: Payer: Self-pay | Admitting: Oncology

## 2014-08-24 ENCOUNTER — Encounter: Payer: Self-pay | Admitting: Internal Medicine

## 2014-08-24 ENCOUNTER — Ambulatory Visit (INDEPENDENT_AMBULATORY_CARE_PROVIDER_SITE_OTHER): Payer: BLUE CROSS/BLUE SHIELD | Admitting: Internal Medicine

## 2014-08-24 VITALS — BP 126/80 | HR 106 | Resp 16 | Ht 62.0 in | Wt 207.0 lb

## 2014-08-24 DIAGNOSIS — H8110 Benign paroxysmal vertigo, unspecified ear: Secondary | ICD-10-CM

## 2014-08-24 DIAGNOSIS — R42 Dizziness and giddiness: Secondary | ICD-10-CM

## 2014-08-24 NOTE — Progress Notes (Signed)
Subjective:    Patient ID: [name], female    DOB: 11-10-1950, 64 y.o.   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  Tamoxifen toxicity evaluation: Patient experiences hot flashes and was prescribed Effexor which seem to be helping her but she continues to have some hot flashes intermittently.  Surveillance: Today's breast exam does not reveal any lumps or nodules. I reviewed the mammograms that were done at [facility] which were normal. She will have annual mammograms and followup in 1 year.  Survivorship: Encouraged her to stay more active and do exercise and eat more fruits and vegetables and less red meat.   Orders Placed This Encounter  Procedures  . CBC with Differential    Standing Status: Future     Number of Occurrences:      Standing Expiration Date: 04/03/2015  . Comprehensive metabolic panel (Cmet) - [facility]    Standing Status: Future     Number of Occurrences:      Standing Expiration Date: 04/03/2015   The patient has a good understanding of the overall plan. she agrees with it. She will call with any problems that may develop before her next visit here.  I spent 20 minutes counseling the patient face to face. The total time spent in the appointment was 25 minutes and more than 50% was on counseling and review of test results        TODAY:  [name] is here for acute visit .  She began 5 days ago with dizziness described as a  spinning sensation and that was associated with headache.  Dizziness occurs with head turning and when getting out of bed.     NO LOC or near LOC symptoms.   No palpitations , chest pressure or SOB.   All symptoms resolved yesterday.     No Known Allergies Past Medical History  Diagnosis Date    . Menopause   . Depression   . 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 Hyperlipidemia   . Hypertension   . GERD (gastroesophageal reflux disease)   . Allergic rhinitis   . Cancer    Past Surgical History  Procedure Laterality Date  . Cholecystectomy  08/2005  . Oophorectomy  1993    one side  . Anterior cervical decomp/discectomy fusion  1999  . Abdominal hysterectomy  05/1993  . Breast reduction surgery  03/24/11    bilateral   . Breast surgery     History   Social History  . Marital Status: Married    Spouse Name: Marya Amsler  . Number of Children: 0  . Years of Education: 2y clg   Occupational History  . [employer]    Social History Main Topics  . Smoking status: Former Smoker -- 1.00 packs/day for 30 years    Quit date: 11/01/2008  . Smokeless tobacco: Never Used  . Alcohol Use: No  . Drug Use: No  . Sexual Activity:    Partners: Male   Other Topics Concern  . Not on file   Social History Narrative   Family History  Problem Relation Age of Onset  . Cancer Father     colon   Patient Active Problem List   Diagnosis Date Noted  . Menopause 12/06/2011  .  History of depression 12/06/2011  . Other and unspecified hyperlipidemia 12/06/2011  . Essential hypertension, benign 12/06/2011  . GERD (gastroesophageal reflux disease) 12/06/2011  . Allergic rhinitis 12/06/2011  . Family history of colon cancer 12/06/2011  . Osteopenia 12/06/2011  . Vitamin d deficiency 12/06/2011  . S/P hysterectomy 12/06/2011  . DCIS (ductal carcinoma in situ)-right breast 01/25/2011   Current Outpatient Prescriptions on File Prior to Visit  Medication Sig Dispense Refill  . atorvastatin (LIPITOR) 40 MG tablet Take 40 mg by mouth daily.      . Calcium Carbonate (CALCIUM 500 PO) Take 1,000 mg by mouth.      . hydrochlorothiazide 25 MG tablet Take 25 mg by mouth daily.      . Multiple Vitamin (MULTIVITAMIN) tablet Take 1 tablet by mouth daily.      .  pantoprazole (PROTONIX) 40 MG tablet Take 40 mg by mouth daily.    Marland Kitchen PRESCRIPTION MEDICATION Inject as directed once a week. B-12 injections    . tamoxifen (NOLVADEX) 20 MG tablet Take 1 tablet (20 mg total) by mouth daily. 90 tablet 3  . venlafaxine XR (EFFEXOR-XR) 75 MG 24 hr capsule Take 150 mg by mouth daily.    . vitamin D, CHOLECALCIFEROL, 400 UNITS tablet Take 1,000 Units by mouth daily.     . vitamin E 400 UNIT capsule Take 400 Units by mouth daily.      . Zinc 50 MG CAPS Take 1 capsule by mouth daily.      Marland Kitchen zolpidem (AMBIEN) 5 MG tablet Take 5 mg by mouth at bedtime as needed.       No current facility-administered medications on file prior to visit.       Review of Systems    see HPI Objective:   Physical Exam Physical Exam  Nursing note and vitals reviewed.  Constitutional: She is oriented to person, place, and time. She appears well-developed and well-nourished.  HENT:  TMs  Canals clear,  No effusion Head: Normocephalic and atraumatic.  Cardiovascular: Normal rate and regular rhythm. Exam reveals no gallop and no friction rub.  No murmur heard.  Pulmonary/Chest: Breath sounds normal. She has no wheezes. She has no rales.  Neurological: She is alert and oriented to person, place, and time.  CNII-Xii intact no nystagmus to lateral gaze Motor  No tremor no drift  5/5 UE and LE Reflexes 2+ symmetric Sensory:  Intact to microfilament.  Cerebellar intact FTN Skin: Skin is warm and dry.  Psychiatric: She has a normal mood and affect. Her behavior is normal.              Assessment & Plan:  Dizziness  /  BPV  Likely BPV  Improved    See me as needed

## 2014-09-13 NOTE — Progress Notes (Signed)
Subjective:    Patient ID: [name], female    DOB: 1951/03/20, 64 y.o.   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  Tamoxifen toxicity evaluation: Patient experiences hot flashes and was prescribed Effexor which seem to be helping her but she continues to have some hot flashes intermittently.  Surveillance: Today's breast exam does not reveal any lumps or nodules. I reviewed the mammograms that were done at [facility] which were normal. She will have annual mammograms and followup in 1 year.  Survivorship: Encouraged her to stay more active and do exercise and eat more fruits and vegetables and less red meat.   Orders Placed This Encounter  Procedures  . CBC with Differential    Standing Status: Future     Number of Occurrences:      Standing Expiration Date: 04/03/2015  . Comprehensive metabolic panel (Cmet) - [facility]    Standing Status: Future     Number of Occurrences:      Standing Expiration Date: 04/03/2015   The patient has a good understanding of the overall plan. she agrees with it. She will call with any problems that may develop before her next visit here.  I spent 20 minutes counseling the patient face to face. The total time spent in the appointment was 25 minutes and more than 50% was on counseling and review of test results         TODAY  [name] is here for CPE  HM:  No Known Allergies Past Medical History  Diagnosis Date  . Menopause   . Depression   . 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 Hyperlipidemia   . Hypertension   . GERD (gastroesophageal reflux disease)   . Allergic rhinitis   . Cancer   . Arthritis     Right Hip   Past Surgical History  Procedure Laterality  Date  . Cholecystectomy  08/2005  . Oophorectomy  1993    one side  . Anterior cervical decomp/discectomy fusion  1999  . Abdominal hysterectomy  05/1993  . Breast reduction surgery  03/24/11    bilateral   . Breast surgery     History   Social History  . Marital Status: Married    Spouse Name: [name]  . Number of Children: 0  . Years of Education: 2y clg   Occupational History  . [employer]    Social History Main Topics  . Smoking status: Former Smoker -- 1.00 packs/day for 30 years    Quit date: 11/01/2008  . Smokeless tobacco: Never Used  . Alcohol Use: No  . Drug Use: No  . Sexual Activity:    Partners: Male   Other Topics Concern  . Not on file   Social History Narrative   Family History  Problem Relation Age of Onset  . Cancer Father     colon   Patient Active Problem List   Diagnosis Date Noted  . Menopause 12/06/2011  . History of depression 12/06/2011  . Other and unspecified hyperlipidemia 12/06/2011  . Essential hypertension, benign 12/06/2011  . GERD (gastroesophageal reflux disease) 12/06/2011  . Allergic rhinitis 12/06/2011  . Family history of colon cancer 12/06/2011  . Osteopenia 12/06/2011  . Vitamin d deficiency 12/06/2011  . S/P hysterectomy  12/06/2011  . DCIS (ductal carcinoma in situ)-right breast 01/25/2011   Current Outpatient Prescriptions on File Prior to Visit  Medication Sig Dispense Refill  . atorvastatin (LIPITOR) 40 MG tablet Take 40 mg by mouth daily.      . Calcium Carbonate (CALCIUM 500 PO) Take 1,000 mg by mouth.      . diclofenac (VOLTAREN) 75 MG EC tablet Take 75 mg by mouth 2 (two) times daily with a meal.  0  . hydrochlorothiazide 25 MG tablet Take 25 mg by mouth daily.      . Multiple Vitamin (MULTIVITAMIN) tablet Take 1 tablet by mouth daily.      . pantoprazole (PROTONIX) 40 MG tablet Take 40 mg by mouth daily.    Marland Kitchen PRESCRIPTION MEDICATION Inject as directed once a week. B-12 injections    . tamoxifen (NOLVADEX) 20 MG  tablet Take 1 tablet (20 mg total) by mouth daily. 90 tablet 3  . venlafaxine XR (EFFEXOR-XR) 75 MG 24 hr capsule Take 150 mg by mouth daily.    . vitamin D, CHOLECALCIFEROL, 400 UNITS tablet Take 1,000 Units by mouth daily.     . vitamin E 400 UNIT capsule Take 400 Units by mouth daily.      . Zinc 50 MG CAPS Take 1 capsule by mouth daily.      Marland Kitchen zolpidem (AMBIEN) 5 MG tablet Take 5 mg by mouth at bedtime as needed.       No current facility-administered medications on file prior to visit.       Review of Systems  Respiratory: Negative for cough, chest tightness, shortness of breath, wheezing and stridor.   Cardiovascular: Negative for chest pain, palpitations and leg swelling.  All other systems reviewed and are negative.      Objective:   Physical Exam  Physical Exam  Nursing note and vitals reviewed.  Constitutional: She is oriented to person, place, and time. She appears well-developed and well-nourished.  HENT:  Head: Normocephalic and atraumatic.  Right Ear: Tympanic membrane and ear canal normal. No drainage. Tympanic membrane is not injected and not erythematous.  Left Ear: Tympanic membrane and ear canal normal. No drainage. Tympanic membrane is not injected and not erythematous.  Nose: Nose normal. Right sinus exhibits no maxillary sinus tenderness and no frontal sinus tenderness. Left sinus exhibits no maxillary sinus tenderness and no frontal sinus tenderness.  Mouth/Throat: Oropharynx is clear and moist. No oral lesions. No oropharyngeal exudate.  Eyes: Conjunctivae and EOM are normal. Pupils are equal, round, and reactive to light.  Neck: Normal range of motion. Neck supple. No JVD present. Carotid bruit is not present. No mass and no thyromegaly present.  Cardiovascular: Normal rate, regular rhythm, S1 normal, S2 normal and intact distal pulses. Exam reveals no gallop and no friction rub.  No murmur heard.  Pulses:  Carotid pulses are 2+ on the right side, and 2+  on the left side.  Dorsalis pedis pulses are 2+ on the right side, and 2+ on the left side.  No carotid bruit. No LE edema  Pulmonary/Chest: Breath sounds normal. She has no wheezes. She has no rales. She exhibits no tenderness.  Abdominal: Soft. Bowel sounds are normal. She exhibits no distension and no mass. There is no hepatosplenomegaly. There is no tenderness. There is no CVA tenderness.  Musculoskeletal: Normal range of motion.  No active synovitis to joints.  Lymphadenopathy:  She has no cervical adenopathy.  She has no axillary adenopathy.  Right: No inguinal and  no supraclavicular adenopathy present.  Left: No inguinal and no supraclavicular adenopathy present.  Neurological: She is alert and oriented to person, place, and time. She has normal strength and normal reflexes. She displays no tremor. No cranial nerve deficit or sensory deficit. Coordination and gait normal.  Skin: Skin is warm and dry. No rash noted. No cyanosis. Nails show no clubbing.  Psychiatric: She has a normal mood and affect. Her speech is normal and behavior is normal. Cognition and memory are normal.          Assessment & Plan:

## 2014-09-14 ENCOUNTER — Encounter: Payer: Self-pay | Admitting: Internal Medicine

## 2014-09-22 ENCOUNTER — Other Ambulatory Visit: Payer: Self-pay | Admitting: *Deleted

## 2014-09-23 ENCOUNTER — Ambulatory Visit (INDEPENDENT_AMBULATORY_CARE_PROVIDER_SITE_OTHER): Payer: BLUE CROSS/BLUE SHIELD | Admitting: Internal Medicine

## 2014-09-23 ENCOUNTER — Encounter: Payer: Self-pay | Admitting: Internal Medicine

## 2014-09-23 VITALS — BP 141/84 | HR 119 | Resp 16 | Ht 62.0 in | Wt 208.0 lb

## 2014-09-23 DIAGNOSIS — R000 Tachycardia, unspecified: Secondary | ICD-10-CM | POA: Diagnosis not present

## 2014-09-23 DIAGNOSIS — Z1211 Encounter for screening for malignant neoplasm of colon: Secondary | ICD-10-CM | POA: Diagnosis not present

## 2014-09-23 DIAGNOSIS — Z0000 Encounter for general adult medical examination without abnormal findings: Secondary | ICD-10-CM | POA: Diagnosis not present

## 2014-09-23 DIAGNOSIS — Z720 Tobacco use: Secondary | ICD-10-CM | POA: Diagnosis not present

## 2014-09-23 DIAGNOSIS — F17201 Nicotine dependence, unspecified, in remission: Secondary | ICD-10-CM

## 2014-09-23 LAB — HEMOCCULT GUIAC POC 1CARD (OFFICE): Fecal Occult Blood, POC: NEGATIVE

## 2014-09-23 MED ORDER — POTASSIUM CHLORIDE CRYS ER 20 MEQ PO TBCR: EXTENDED_RELEASE_TABLET | ORAL | Status: DC

## 2014-09-23 NOTE — Progress Notes (Addendum)
Subjective:    Patient ID: [name], female    DOB: [birth year], 64 y.o.   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  Tamoxifen toxicity evaluation: Patient experiences hot flashes and was prescribed Effexor which seem to be helping her but she continues to have some hot flashes intermittently.  Surveillance: Today's breast exam does not reveal any lumps or nodules. I reviewed the mammograms that were done at [facility] which were normal. She will have annual mammograms and followup in 1 year.  Survivorship: Encouraged her to stay more active and do exercise and eat more fruits and vegetables and less red meat.  TODAY  [name] is here for  CPE  HM:  Due for mm and dexa 01/2014  She is a 30 pack year smoker  Quit smoking 5 years ago Colonoscopy done 2012  S/P hysterectomy  Effexor helping hot flushes some   Does not want to go on higher dose   See K  sheis on  HCTZ daily  See pulse  Pt states she has been hurrying to get to office on time   No Known Allergies Past Medical History  Diagnosis Date  . Menopause   . Depression   . 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 Hyperlipidemia   . Hypertension   . GERD (gastroesophageal reflux disease)   . Allergic rhinitis   . Cancer   . Arthritis     Right Hip   Past Surgical History  Procedure Laterality Date  . Cholecystectomy  08/2005  . Oophorectomy  1993    one side  . Anterior cervical decomp/discectomy fusion  1999  . Abdominal hysterectomy  05/1993  . Breast reduction surgery  03/24/11    bilateral   . Breast surgery     History   Social History  . Marital Status: Married    Spouse Name: [name]  . Number of Children: 0  . Years of Education: 2y clg   Occupational History  .  [employer]    Social History Main Topics  . Smoking status: Former Smoker -- 1.00 packs/day for 30 years    Quit date: 11/01/2008  . Smokeless tobacco: Never Used  . Alcohol Use: No  . Drug Use: No  . Sexual Activity:    Partners: Male   Other Topics Concern  . Not on file   Social History Narrative   Family History  Problem Relation Age of Onset  . Cancer Father     colon   Patient Active Problem List   Diagnosis Date Noted  . Menopause 12/06/2011  . History of depression 12/06/2011  . Other and unspecified hyperlipidemia 12/06/2011  . Essential hypertension, benign 12/06/2011  . GERD (gastroesophageal reflux disease) 12/06/2011  . Allergic rhinitis 12/06/2011  . Family history of colon cancer 12/06/2011  . Osteopenia 12/06/2011  . Vitamin d deficiency 12/06/2011  . S/P hysterectomy 12/06/2011  . DCIS (ductal carcinoma in situ)-right breast 01/25/2011   Current Outpatient Prescriptions on File Prior to Visit  Medication Sig Dispense Refill  . atorvastatin (LIPITOR) 40 MG tablet Take 40 mg by mouth daily.      . Calcium Carbonate (CALCIUM 500 PO) Take 1,000 mg by mouth.      . diclofenac (VOLTAREN) 75 MG EC  tablet Take 75 mg by mouth 2 (two) times daily with a meal.  0  . hydrochlorothiazide 25 MG tablet Take 25 mg by mouth daily.      . Multiple Vitamin (MULTIVITAMIN) tablet Take 1 tablet by mouth daily.      . pantoprazole (PROTONIX) 40 MG tablet Take 40 mg by mouth daily.    Marland Kitchen PRESCRIPTION MEDICATION Inject as directed once a week. B-12 injections    . tamoxifen (NOLVADEX) 20 MG tablet Take 1 tablet (20 mg total) by mouth daily. 90 tablet 3  . venlafaxine XR (EFFEXOR-XR) 75 MG 24 hr capsule Take 150 mg by mouth daily.    . vitamin D, CHOLECALCIFEROL, 400 UNITS tablet Take 1,000 Units by mouth daily.     . vitamin E 400 UNIT capsule Take 400 Units by mouth daily.      . Zinc 50 MG CAPS Take 1 capsule by mouth daily.      Marland Kitchen zolpidem (AMBIEN) 5 MG tablet Take 5 mg by  mouth at bedtime as needed.       No current facility-administered medications on file prior to visit.      Review of Systems  Respiratory: Negative for cough, chest tightness, shortness of breath and wheezing.   Cardiovascular: Negative for chest pain, palpitations and leg swelling.  All other systems reviewed and are negative.      Objective:   Physical Exam  Physical Exam  Nursing note and vitals reviewed.  Constitutional: She is oriented to person, place, and time. She appears well-developed and well-nourished.  HENT:  Head: Normocephalic and atraumatic.  Right Ear: Tympanic membrane and ear canal normal. No drainage. Tympanic membrane is not injected and not erythematous.  Left Ear: Tympanic membrane and ear canal normal. No drainage. Tympanic membrane is not injected and not erythematous.  Nose: Nose normal. Right sinus exhibits no maxillary sinus tenderness and no frontal sinus tenderness. Left sinus exhibits no maxillary sinus tenderness and no frontal sinus tenderness.  Mouth/Throat: Oropharynx is clear and moist. No oral lesions. No oropharyngeal exudate.  Eyes: Conjunctivae and EOM are normal. Pupils are equal, round, and reactive to light.  Neck: Normal range of motion. Neck supple. No JVD present. Carotid bruit is not present. No mass and no thyromegaly present.  Cardiovascular: Normal rate, regular rhythm, S1 normal, S2 normal and intact distal pulses. Exam reveals no gallop and no friction rub.  No murmur heard.  Pulses:  Carotid pulses are 2+ on the right side, and 2+ on the left side.  Dorsalis pedis pulses are 2+ on the right side, and 2+ on the left side.  No carotid bruit. No LE edema  Pulmonary/Chest: Breath sounds normal. She has no wheezes. She has no rales. She exhibits no tenderness.  Breast no discrete mass no nipple discharge no axillary adenopathy bilaterally Abdominal: Soft. Bowel sounds are normal. She exhibits no distension and no mass. There is no  hepatosplenomegaly. There is no tenderness. There is no CVA tenderness.  Musculoskeletal: Normal range of motion.  No active synovitis to joints.  Lymphadenopathy:  She has no cervical adenopathy.  She has no axillary adenopathy.  Right: No inguinal and no supraclavicular adenopathy present.  Left: No inguinal and no supraclavicular adenopathy present.  Neurological: She is alert and oriented to person, place, and time. She has normal strength and normal reflexes. She displays no tremor. No cranial nerve deficit or sensory deficit. Coordination and gait normal.  Skin: Skin is warm and dry. No rash  noted. No cyanosis. Nails show no clubbing.  Psychiatric: She has a normal mood and affect. Her speech is normal and behavior is normal. Cognition and memory are normal.          Assessment & Plan:  HM  UTD with mm  S/P hysterectomy   30 pack year smoker   Will schedule screening Ct   Tachycardia  EKG today no change from 2012 with exception of rate 101 and few pvc's  Hyperlipidemia  Will check today  Low K  Will RX K-dur 20 meq   Advised to be sure to take K when she take her HCTZ  DCIS on tamoxifen  Will be off tamoxifen in a few months   On effexor to help control hot flushes  Osteopenia  Advised to have DEXA when she get mm this year  Advised Calcium Vitamin D

## 2014-09-24 LAB — COMPLETE METABOLIC PANEL WITH GFR
ALBUMIN: 4.1 g/dL (ref 3.5–5.2)
ALT: 35 U/L (ref 0–35)
AST: 34 U/L (ref 0–37)
Alkaline Phosphatase: 76 U/L (ref 39–117)
BUN: 19 mg/dL (ref 6–23)
CALCIUM: 9.8 mg/dL (ref 8.4–10.5)
CO2: 32 meq/L (ref 19–32)
Chloride: 98 mEq/L (ref 96–112)
Creat: 0.78 mg/dL (ref 0.50–1.10)
GFR, Est African American: >89 mL/min
GFR, Est Non African American: 82 mL/min
GLUCOSE: 111 mg/dL — AB (ref 70–99)
Potassium: 4 mEq/L (ref 3.5–5.3)
Sodium: 141 mEq/L (ref 135–145)
Total Bilirubin: 0.8 mg/dL (ref 0.2–1.2)
Total Protein: 7 g/dL (ref 6.0–8.3)

## 2014-09-24 LAB — CBC WITH DIFFERENTIAL/PLATELET
Basophils Absolute: 0 10*3/uL (ref 0.0–0.1)
Basophils Relative: 0 % (ref 0–1)
Eosinophils Absolute: 0.2 10*3/uL (ref 0.0–0.7)
Eosinophils Relative: 2 % (ref 0–5)
HCT: 40.6 % (ref 36.0–46.0)
Hemoglobin: 13.5 g/dL (ref 12.0–15.0)
LYMPHS ABS: 3.2 10*3/uL (ref 0.7–4.0)
Lymphocytes Relative: 27 % (ref 12–46)
MCH: 30.7 pg (ref 26.0–34.0)
MCHC: 33.3 g/dL (ref 30.0–36.0)
MCV: 92.3 fL (ref 78.0–100.0)
MONOS PCT: 11 % (ref 3–12)
MPV: 10 fL (ref 8.6–12.4)
Monocytes Absolute: 1.3 10*3/uL — ABNORMAL HIGH (ref 0.1–1.0)
NEUTROS PCT: 60 % (ref 43–77)
Neutro Abs: 7.1 10*3/uL (ref 1.7–7.7)
Platelets: 302 10*3/uL (ref 150–400)
RBC: 4.4 MIL/uL (ref 3.87–5.11)
RDW: 13.9 % (ref 11.5–15.5)
WBC: 11.8 10*3/uL — ABNORMAL HIGH (ref 4.0–10.5)

## 2014-09-24 LAB — LIPID PANEL
Cholesterol: 174 mg/dL (ref 0–200)
HDL: 36 mg/dL — ABNORMAL LOW (ref >=46)
LDL CALC: 100 mg/dL — AB (ref 0–99)
Total CHOL/HDL Ratio: 4.8 Ratio
Triglycerides: 190 mg/dL — ABNORMAL HIGH (ref <150)
VLDL: 38 mg/dL (ref 0–40)

## 2014-09-24 LAB — VITAMIN D 25 HYDROXY (VIT D DEFICIENCY, FRACTURES): Vit D, 25-Hydroxy: 48 ng/mL (ref 30–100)

## 2014-09-24 LAB — TSH: TSH: 1.7 u[IU]/mL (ref 0.350–4.500)

## 2014-10-25 ENCOUNTER — Telehealth: Payer: Self-pay | Admitting: Internal Medicine

## 2014-10-25 NOTE — Telephone Encounter (Signed)
[name] and I discussed screening Chest CT at her CPE  - order is in the chart  Please cal pt and document if she is going to have this done  Route back to me  thanks

## 2014-11-02 NOTE — Telephone Encounter (Signed)
I have left patient several messages in regards to her screening Chest CT- with no returned call. -eh

## 2014-11-10 ENCOUNTER — Encounter: Payer: Self-pay | Admitting: Internal Medicine

## 2015-04-07 ENCOUNTER — Other Ambulatory Visit: Payer: Self-pay | Admitting: *Deleted

## 2015-04-07 DIAGNOSIS — D0510 Intraductal carcinoma in situ of unspecified breast: Secondary | ICD-10-CM

## 2015-04-07 NOTE — Assessment & Plan Note (Signed)
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  Tamoxifen toxicity evaluation: Patient experiences hot flashes and was prescribed Effexor which seem to be helping her but she continues to have some hot flashes intermittently.  Surveillance: Today's breast exam does not reveal any lumps or nodules. I reviewed the mammograms that were done at [facility] which were normal. She will have annual mammograms and followup in 1 year.  Survivorship: Encouraged her to stay more active and do exercise and eat more fruits and vegetables and less red meat.

## 2015-04-08 ENCOUNTER — Telehealth: Payer: Self-pay | Admitting: Hematology and Oncology

## 2015-04-08 ENCOUNTER — Encounter: Payer: Self-pay | Admitting: Hematology and Oncology

## 2015-04-08 ENCOUNTER — Ambulatory Visit (HOSPITAL_BASED_OUTPATIENT_CLINIC_OR_DEPARTMENT_OTHER): Payer: BLUE CROSS/BLUE SHIELD | Admitting: Hematology and Oncology

## 2015-04-08 ENCOUNTER — Other Ambulatory Visit (HOSPITAL_BASED_OUTPATIENT_CLINIC_OR_DEPARTMENT_OTHER): Payer: BLUE CROSS/BLUE SHIELD

## 2015-04-08 VITALS — BP 153/91 | HR 108 | Temp 97.7°F | Resp 18 | Ht 62.0 in | Wt 217.9 lb

## 2015-04-08 DIAGNOSIS — D0510 Intraductal carcinoma in situ of unspecified breast: Secondary | ICD-10-CM

## 2015-04-08 DIAGNOSIS — Z170 Estrogen receptor positive status [ER+]: Secondary | ICD-10-CM

## 2015-04-08 DIAGNOSIS — N951 Menopausal and female climacteric states: Secondary | ICD-10-CM

## 2015-04-08 DIAGNOSIS — D0511 Intraductal carcinoma in situ of right breast: Secondary | ICD-10-CM | POA: Diagnosis not present

## 2015-04-08 LAB — CBC WITH DIFFERENTIAL/PLATELET
BASO%: 1 % (ref 0.0–2.0)
Basophils Absolute: 0.1 10*3/uL (ref 0.0–0.1)
EOS ABS: 0.4 10*3/uL (ref 0.0–0.5)
EOS%: 3.1 % (ref 0.0–7.0)
HEMATOCRIT: 40.9 % (ref 34.8–46.6)
HEMOGLOBIN: 13.4 g/dL (ref 11.6–15.9)
LYMPH#: 2.3 10*3/uL (ref 0.9–3.3)
LYMPH%: 20.3 % (ref 14.0–49.7)
MCH: 29.9 pg (ref 25.1–34.0)
MCHC: 32.8 g/dL (ref 31.5–36.0)
MCV: 91.1 fL (ref 79.5–101.0)
MONO#: 1.1 10*3/uL — AB (ref 0.1–0.9)
MONO%: 9.4 % (ref 0.0–14.0)
NEUT%: 66.2 % (ref 38.4–76.8)
NEUTROS ABS: 7.5 10*3/uL — AB (ref 1.5–6.5)
PLATELETS: 323 10*3/uL (ref 145–400)
RBC: 4.48 10*6/uL (ref 3.70–5.45)
RDW: 14.6 % — ABNORMAL HIGH (ref 11.2–14.5)
WBC: 11.3 10*3/uL — AB (ref 3.9–10.3)

## 2015-04-08 LAB — COMPREHENSIVE METABOLIC PANEL (CC13)
ALBUMIN: 3.9 g/dL (ref 3.5–5.0)
ALK PHOS: 113 U/L (ref 40–150)
ALT: 47 U/L (ref 0–55)
ANION GAP: 9 meq/L (ref 3–11)
AST: 33 U/L (ref 5–34)
BUN: 13.2 mg/dL (ref 7.0–26.0)
CALCIUM: 9.7 mg/dL (ref 8.4–10.4)
CO2: 28 mEq/L (ref 22–29)
Chloride: 106 mEq/L (ref 98–109)
Creatinine: 0.9 mg/dL (ref 0.6–1.1)
EGFR: 73 mL/min/{1.73_m2} — AB (ref >90)
Glucose: 108 mg/dl (ref 70–140)
Potassium: 4.5 mEq/L (ref 3.5–5.1)
Sodium: 143 mEq/L (ref 136–145)
TOTAL PROTEIN: 7.3 g/dL (ref 6.4–8.3)
Total Bilirubin: 0.84 mg/dL (ref 0.20–1.20)

## 2015-04-08 NOTE — Telephone Encounter (Signed)
Appointments made and avs printed for pateint °

## 2015-04-08 NOTE — Progress Notes (Signed)
Patient Care Team: Lanice Shirts, MD as PCP - General (Internal Medicine)  DIAGNOSIS:  DCIS of the right breast.   PRIOR THERAPY:  #1 patient underwent a lumpectomy on 04/22/2010 for a DCIS of the right breast.  #2 she then received radiation therapy to the right breast completing on [name] 27 2012.  #3 she was begun on tamoxifen 20 mg daily starting in February 2012. A total of 5 years of therapy is planned.  #4 patient does experience night sweats and hot flashes due to tamoxifen. She is currently on Effexor 75 mg daily for this. She also has depression so this is a secondary gain.   CURRENT THERAPY: Tamoxifen 20 mg daily with Effexor 75 mg daily  CHIEF COMPLIANT: follow-up on tamoxifen  INTERVAL HISTORY: [name] is a 15 with above-mentioned history of DCIS right breast treated with lumpectomy radiation is currently on tamoxifen since February 2012. She is managing the hot flashes and night sweats fairly well with Effexor. She denies any lumps or nodules in the breasts.  REVIEW OF SYSTEMS:   Constitutional: Denies fevers, chills or abnormal weight loss Eyes: Denies blurriness of vision Ears, nose, mouth, throat, and face: Denies mucositis or sore throat Respiratory: Denies cough, dyspnea or wheezes Cardiovascular: Denies palpitation, chest discomfort or lower extremity swelling Gastrointestinal:  Denies nausea, heartburn or change in bowel habits Skin: Denies abnormal skin rashes Lymphatics: Denies new lymphadenopathy or easy bruising Neurological:Denies numbness, tingling or new weaknesses Behavioral/Psych: Mood is stable, no new changes  Breast:  denies any pain or lumps or nodules in either breasts All other systems were reviewed with the patient and are negative.  I have reviewed the past medical history, past surgical history, social history and family history with the patient and they are unchanged from previous note.  ALLERGIES:  has No Known  Allergies.  MEDICATIONS:  Current Outpatient Prescriptions  Medication Sig Dispense Refill  . atorvastatin (LIPITOR) 40 MG tablet Take 40 mg by mouth daily.      . Calcium Carbonate (CALCIUM 500 PO) Take 1,000 mg by mouth.      . diclofenac (VOLTAREN) 75 MG EC tablet Take 75 mg by mouth 2 (two) times daily with a meal.  0  . hydrochlorothiazide 25 MG tablet Take 25 mg by mouth daily.      . Multiple Vitamin (MULTIVITAMIN) tablet Take 1 tablet by mouth daily.      . pantoprazole (PROTONIX) 40 MG tablet Take 40 mg by mouth daily.    . potassium chloride SA (K-DUR,KLOR-CON) 20 MEQ tablet Take one daily with diuretic 90 tablet 3  . PRESCRIPTION MEDICATION Inject as directed once a week. B-12 injections    . tamoxifen (NOLVADEX) 20 MG tablet Take 1 tablet (20 mg total) by mouth daily. 90 tablet 3  . venlafaxine XR (EFFEXOR-XR) 75 MG 24 hr capsule Take 150 mg by mouth daily.    . vitamin D, CHOLECALCIFEROL, 400 UNITS tablet Take 1,000 Units by mouth daily.     . vitamin E 400 UNIT capsule Take 400 Units by mouth daily.      . Zinc 50 MG CAPS Take 1 capsule by mouth daily.      Marland Kitchen zolpidem (AMBIEN) 5 MG tablet Take 5 mg by mouth at bedtime as needed.       No current facility-administered medications for this visit.    PHYSICAL EXAMINATION: ECOG PERFORMANCE STATUS: 0 - Asymptomatic  Filed Vitals:   04/08/15 0935  BP: 153/91  Pulse: 108  Temp: 97.7 F (36.5 C)  Resp: 18   Filed Weights   04/08/15 0935  Weight: 217 lb 14.4 oz (98.839 kg)    GENERAL:alert, no distress and comfortable SKIN: skin color, texture, turgor are normal, no rashes or significant lesions EYES: normal, Conjunctiva are pink and non-injected, sclera clear OROPHARYNX:no exudate, no erythema and lips, buccal mucosa, and tongue normal  NECK: supple, thyroid normal size, non-tender, without nodularity LYMPH:  no palpable lymphadenopathy in the cervical, axillary or inguinal LUNGS: clear to auscultation and  percussion with normal breathing effort HEART: regular rate & rhythm and no murmurs and no lower extremity edema ABDOMEN:abdomen soft, non-tender and normal bowel sounds Musculoskeletal:no cyanosis of digits and no clubbing  NEURO: alert & oriented x 3 with fluent speech, no focal motor/sensory deficits BREAST: No palpable masses or nodules in either right or left breasts. Bilateral breast reduction surgery scars are noted.No palpable axillary supraclavicular or infraclavicular adenopathy no breast tenderness or nipple discharge. (exam performed in the presence of a chaperone)  LABORATORY DATA:  I have reviewed the data as listed   Chemistry      Component Value Date/Time   NA 141 09/23/2014 1401   NA 142 04/03/2014 1007   K 4.0 09/23/2014 1401   K 3.3* 04/03/2014 1007   CL 98 09/23/2014 1401   CL 99 07/03/2012 1525   CO2 32 09/23/2014 1401   CO2 28 04/03/2014 1007   BUN 19 09/23/2014 1401   BUN 10.8 04/03/2014 1007   CREATININE 0.78 09/23/2014 1401   CREATININE 0.8 04/03/2014 1007   CREATININE 0.84 11/03/2011 0852      Component Value Date/Time   CALCIUM 9.8 09/23/2014 1401   CALCIUM 9.5 04/03/2014 1007   ALKPHOS 76 09/23/2014 1401   ALKPHOS 78 04/03/2014 1007   AST 34 09/23/2014 1401   AST 26 04/03/2014 1007   ALT 35 09/23/2014 1401   ALT 31 04/03/2014 1007   BILITOT 0.8 09/23/2014 1401   BILITOT 0.63 04/03/2014 1007       Lab Results  Component Value Date   WBC 11.3* 04/08/2015   HGB 13.4 04/08/2015   HCT 40.9 04/08/2015   MCV 91.1 04/08/2015   PLT 323 04/08/2015   NEUTROABS 7.5* 04/08/2015   ASSESSMENT & PLAN:  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  Tamoxifen toxicity evaluation: Patient experiences hot flashes and was prescribed Effexor which seem to be helping her but she continues to have some hot flashes  intermittently.  Surveillance: Today's breast exam does not reveal any lumps or nodules. I reviewed the mammograms that were done at [facility] which were normal. She will have annual mammograms and followup in 1 year.  Survivorship: Encouraged her to stay more active and do exercise and eat more fruits and vegetables and less red meat. Patient has arthritis and cannot do a lot of physical activity. She is very concerned about her weight gain. She has gained about 10 pounds in the last 1-2 months. We discussed enrolling in a weight loss program. I will inform her if the end up starting a weight loss clinic in the cancer center.  No orders of the defined types were placed in this encounter.   The patient has a good understanding of the overall plan. she agrees with it. she will call with any problems that may develop before the next  visit here.    Rulon Eisenmenger, MD 04/08/2015

## 2015-07-14 ENCOUNTER — Telehealth: Payer: Self-pay | Admitting: Nutrition

## 2015-07-14 NOTE — Telephone Encounter (Signed)
Patient was contacted regarding new [place] healthy eating and activity program. Patient was referred by Dr. Lindi Adie. Left patient information on multiple phone with contact information if patient is interested

## 2015-07-14 NOTE — Telephone Encounter (Signed)
error 

## 2016-04-06 ENCOUNTER — Encounter: Payer: BLUE CROSS/BLUE SHIELD | Admitting: Adult Health

## 2016-04-06 NOTE — Progress Notes (Deleted)
CLINIC:  Survivorship   REASON FOR VISIT:  Routine follow-up for history of breast cancer.   BRIEF ONCOLOGIC HISTORY:   No history exists.    INTERVAL HISTORY:  [name] presents to the Survivorship Clinic today for routine follow-up for her history of breast cancer.  Overall, she reports feeling quite well. ***   REVIEW OF SYSTEMS:     GU: Denies vaginal bleeding, discharge, or dryness.  Breast: Denies any new nodularity, masses, tenderness, nipple changes, or nipple discharge.    A 14-point review of systems was completed and was negative, except as noted above.    PAST MEDICAL/SURGICAL HISTORY:  Past Medical History:  Diagnosis Date  . Allergic rhinitis   . Arthritis    Right Hip  . Cancer ([place])   . 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 Depression   . GERD (gastroesophageal reflux disease)   . Hyperlipidemia   . Hypertension   . Menopause    Past Surgical History:  Procedure Laterality Date  . ABDOMINAL HYSTERECTOMY  05/1993  . ANTERIOR CERVICAL DECOMP/DISCECTOMY FUSION  1999  . BREAST REDUCTION SURGERY  03/24/11   bilateral   . BREAST SURGERY    . CHOLECYSTECTOMY  08/2005  . OOPHORECTOMY  1993   one side     ALLERGIES:  No Known Allergies   CURRENT MEDICATIONS:  Outpatient Encounter Prescriptions as of 04/06/2016  Medication Sig Note  . atorvastatin (LIPITOR) 40 MG tablet Take 40 mg by mouth daily.     . Calcium Carbonate (CALCIUM 500 PO) Take 1,000 mg by mouth.     . diclofenac (VOLTAREN) 75 MG EC tablet Take 75 mg by mouth 2 (two) times daily with a meal. 08/24/2014: Received from: External Pharmacy  . hydrochlorothiazide 25 MG tablet Take 25 mg by mouth daily.     . Multiple Vitamin (MULTIVITAMIN) tablet Take 1 tablet by mouth daily.     . pantoprazole (PROTONIX) 40 MG tablet Take 40 mg by mouth daily.   . potassium chloride SA (K-DUR,KLOR-CON) 20 MEQ tablet Take one daily with diuretic   . PRESCRIPTION  MEDICATION Inject as directed once a week. B-12 injections   . tamoxifen (NOLVADEX) 20 MG tablet Take 1 tablet (20 mg total) by mouth daily.   Marland Kitchen venlafaxine XR (EFFEXOR-XR) 75 MG 24 hr capsule Take 150 mg by mouth daily.   . vitamin D, CHOLECALCIFEROL, 400 UNITS tablet Take 1,000 Units by mouth daily.    . vitamin E 400 UNIT capsule Take 400 Units by mouth daily.     . Zinc 50 MG CAPS Take 1 capsule by mouth daily.     Marland Kitchen zolpidem (AMBIEN) 5 MG tablet Take 5 mg by mouth at bedtime as needed.      No facility-administered encounter medications on file as of 04/06/2016.      ONCOLOGIC FAMILY HISTORY:  Family History  Problem Relation Age of Onset  . Cancer Father     colon    GENETIC COUNSELING/TESTING: ***  SOCIAL HISTORY:  [name] is /single/married/divorced/widowed/separated and lives alone/with her spouse/family/friend in (city), [place].  She has (#) children and they live in (city).  Ms. Verdi is currently retired/disabled/working part-time/full-time as ***.  She denies any current or history of tobacco, alcohol, or illicit drug use.     PHYSICAL EXAMINATION:  Vital Signs: There were no vitals filed for this visit. There were no vitals filed for this visit. General:  Well-nourished, well-appearing female in no acute distress.  She is unaccompanied/accompanied in clinic by her ***** today.   HEENT: Head is normocephalic.  Pupils equal and reactive to light. Conjunctivae clear without exudate.  Sclerae anicteric. Oral mucosa is pink, moist.  Oropharynx is pink without lesions or erythema.  Lymph: No cervical, supraclavicular, or infraclavicular lymphadenopathy noted on palpation.  Cardiovascular: Regular rate and rhythm.Marland Kitchen Respiratory: Clear to auscultation bilaterally. Chest expansion symmetric; breathing non-labored.  Breast Exam:  -Left breast: No appreciable masses on palpation. No skin redness, thickening, or peau d'orange appearance; no nipple retraction  or nipple discharge; mild distortion in symmetry at previous lumpectomy site***healed scar without erythema or nodularity.  -Right breast: No appreciable masses on palpation. No skin redness, thickening, or peau d'orange appearance; no nipple retraction or nipple discharge; mild distortion in symmetry at previous lumpectomy site***healed scar without erythema or nodularity. -Axilla: No axillary adenopathy bilaterally.  GI: Abdomen soft and round; non-tender, non-distended. Bowel sounds normoactive. No hepatosplenomegaly.   GU: Deferred.  Neuro: No focal deficits. Steady gait.  Psych: Mood and affect normal and appropriate for situation.  Extremities: No edema. Skin: Warm and dry.  LABORATORY DATA:  None for this visit***   DIAGNOSTIC IMAGING:  Most recent mammogram: *** Breast MRI: ***   ASSESSMENT AND PLAN:  Ms.. Bunn is a pleasant 65 y.o. female with history of Stage *** right/left breast invasive ductal carcinoma, ER+/PR+/HER2-, diagnosed in (date), treated with lumpectomy, adjuvant radiation therapy, and anti-estrogen therapy with *** beginning in (date).  She presents to the Survivorship Clinic for surveillance and routine follow-up.   1. History of breast cancer:  Ms. Kamphuis is currently clinically and radiographically without evidence of disease or recurrence of breast cancer. She will follow-up with her medical oncologist, Dr. Carrington Clamp or her surgeon, Dr. Domingo Madeira in their clinic in (#) months with labs and physical exam per surveillance protocol.  She will continue her endocrine therapy with *** as prescribed by Dr. Carrington Clamp. She was instructed to make me aware if she begins to experience any side effects of the medication and I would see her back in clinic to help manage those side effects, as needed.  #. Problem(s) at Visit___________________.  #. Bone Health:  Given Ms. Warga's age, history of breast cancer, and her current  anti-estrogen therapy with ________, she is at risk for bone demineralization. Her last DEXA scan was on **/**/20**.  In the meantime, she was encouraged to increase her consumption of foods rich in calcium, as well as increase her weight-bearing activities.  She was given education on specific food and activities to promote bone health.  #. Cancer screening:  Due to Ms. Hollingsworth's history and her age, she should receive screening for skin cancers, colon cancer, and ***gynecologic cancers. She was encouraged to follow-up with her PCP for appropriate cancer screenings.   #. Health maintenance and wellness promotion: Ms. Dunwoody was encouraged to consume 5-7 servings of fruits and vegetables per day. She was also encouraged to engage in moderate to vigorous exercise for 30 minutes per day most days of the week. She was instructed to limit her alcohol consumption and continue to abstain from tobacco use/was encouraged stop smoking.  ***    Dispo:  -Annual mammogram due at [facility] in ***/2018; orders placed today  -Return to cancer center to see Survivorship NP in 03/2017   A total of (#) minutes of face-to-face time was spent with this patient with greater than 50% of that time in counseling and  care-coordination.   Mike Craze, NP Survivorship Program [facility] [phone]   Note: PRIMARY CARE PROVIDER Kelton Pillar, [place] [phone]

## 2016-04-28 ENCOUNTER — Telehealth: Payer: Self-pay

## 2016-04-28 ENCOUNTER — Telehealth: Payer: Self-pay | Admitting: Hematology and Oncology

## 2016-04-28 NOTE — Telephone Encounter (Signed)
Scheduled 11/28 appointments per MD message

## 2016-04-28 NOTE — Telephone Encounter (Signed)
Referral and patient information received from Dr. Coralyn Mark from [facility] at [facility] regarding abnormal mammogram and exam.  Given to Dr. Lindi Adie for review.

## 2016-05-08 ENCOUNTER — Other Ambulatory Visit: Payer: Self-pay | Admitting: Internal Medicine

## 2016-05-08 DIAGNOSIS — F17210 Nicotine dependence, cigarettes, uncomplicated: Secondary | ICD-10-CM

## 2016-05-22 ENCOUNTER — Other Ambulatory Visit: Payer: Self-pay

## 2016-05-22 DIAGNOSIS — D0510 Intraductal carcinoma in situ of unspecified breast: Secondary | ICD-10-CM

## 2016-05-22 NOTE — Assessment & Plan Note (Signed)
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  Tamoxifen toxicity evaluation: Patient experiences hot flashes and was prescribed Effexor which seem to be helping her but she continues to have some hot flashes intermittently.  Surveillance: Today's breast exam does not reveal any lumps or nodules. I reviewed the mammograms that were done at [facility] which were normal. She will have annual mammograms and followup in 1 year.  Survivorship: Encouraged her to stay more active and do exercise and eat more fruits and vegetables and less red meat. Patient has arthritis and cannot do a lot of physical activity. She is very concerned about her weight gain. She has gained about 10 pounds in the last 1-2 months. We discussed enrolling in a weight loss program.  RTC in 1 year

## 2016-05-23 ENCOUNTER — Encounter: Payer: Self-pay | Admitting: Hematology and Oncology

## 2016-05-23 ENCOUNTER — Ambulatory Visit (HOSPITAL_BASED_OUTPATIENT_CLINIC_OR_DEPARTMENT_OTHER): Payer: BLUE CROSS/BLUE SHIELD | Admitting: Hematology and Oncology

## 2016-05-23 ENCOUNTER — Other Ambulatory Visit (HOSPITAL_BASED_OUTPATIENT_CLINIC_OR_DEPARTMENT_OTHER): Payer: BLUE CROSS/BLUE SHIELD

## 2016-05-23 DIAGNOSIS — Z170 Estrogen receptor positive status [ER+]: Secondary | ICD-10-CM

## 2016-05-23 DIAGNOSIS — N951 Menopausal and female climacteric states: Secondary | ICD-10-CM

## 2016-05-23 DIAGNOSIS — D0510 Intraductal carcinoma in situ of unspecified breast: Secondary | ICD-10-CM

## 2016-05-23 DIAGNOSIS — D0511 Intraductal carcinoma in situ of right breast: Secondary | ICD-10-CM | POA: Diagnosis not present

## 2016-05-23 LAB — CBC WITH DIFFERENTIAL/PLATELET
BASO%: 0.8 % (ref 0.0–2.0)
Basophils Absolute: 0.1 10*3/uL (ref 0.0–0.1)
EOS%: 3.2 % (ref 0.0–7.0)
Eosinophils Absolute: 0.3 10*3/uL (ref 0.0–0.5)
HCT: 41.2 % (ref 34.8–46.6)
HGB: 13.4 g/dL (ref 11.6–15.9)
LYMPH#: 2.2 10*3/uL (ref 0.9–3.3)
LYMPH%: 23.2 % (ref 14.0–49.7)
MCH: 30.1 pg (ref 25.1–34.0)
MCHC: 32.5 g/dL (ref 31.5–36.0)
MCV: 92.6 fL (ref 79.5–101.0)
MONO#: 0.8 10*3/uL (ref 0.1–0.9)
MONO%: 8.6 % (ref 0.0–14.0)
NEUT%: 64.2 % (ref 38.4–76.8)
NEUTROS ABS: 6.1 10*3/uL (ref 1.5–6.5)
PLATELETS: 281 10*3/uL (ref 145–400)
RBC: 4.45 10*6/uL (ref 3.70–5.45)
RDW: 14.3 % (ref 11.2–14.5)
WBC: 9.6 10*3/uL (ref 3.9–10.3)

## 2016-05-23 LAB — COMPREHENSIVE METABOLIC PANEL
ALT: 35 U/L (ref 0–55)
ANION GAP: 9 meq/L (ref 3–11)
AST: 29 U/L (ref 5–34)
Albumin: 3.5 g/dL (ref 3.5–5.0)
Alkaline Phosphatase: 111 U/L (ref 40–150)
BILIRUBIN TOTAL: 1.12 mg/dL (ref 0.20–1.20)
BUN: 11.8 mg/dL (ref 7.0–26.0)
CO2: 28 meq/L (ref 22–29)
CREATININE: 0.8 mg/dL (ref 0.6–1.1)
Calcium: 9.6 mg/dL (ref 8.4–10.4)
Chloride: 106 mEq/L (ref 98–109)
EGFR: 76 mL/min/{1.73_m2} — ABNORMAL LOW (ref >90)
GLUCOSE: 109 mg/dL (ref 70–140)
Potassium: 3.6 mEq/L (ref 3.5–5.1)
SODIUM: 143 meq/L (ref 136–145)
TOTAL PROTEIN: 7.1 g/dL (ref 6.4–8.3)

## 2016-05-23 NOTE — Progress Notes (Signed)
Patient Care Team: Lanice Shirts, MD as PCP - General (Internal Medicine)  DIAGNOSIS:  Encounter Diagnosis  Name Primary?  . Ductal carcinoma in situ (DCIS) of right breast    PRIOR THERAPY:  #1 patient underwent a lumpectomy on 04/22/2010 for a DCIS of the right breast.  #2 she then received radiation therapy to the right breast completing on [name] 27 2012.  #3 she was begun on tamoxifen 20 mg daily starting in February 2012. Completed in February 2017 #4 patient does experience night sweats and hot flashes due to tamoxifen. She is currently on Effexor 75 mg daily for this. She also has depression so this is a secondary gain.   CHIEF COMPLIANT: Follow-up after recent mammogram showed dystrophic calcifications in the right breast  INTERVAL HISTORY: HARRY SCHERER is a 65 year old with above-mentioned history of right breast DCIS treated with lumpectomy and adjuvant radiation. She completed 5 years of antiestrogen therapy with tamoxifen as of February 2017.  She denies any lumps or nodules in the breast. She continues to struggle with weight issues. She had been to the weight loss program of the cancer center. Her mammograms done in a 24 2017 at [facility] revealed that she has what appear to be coarse dystrophic calcifications that are probably benign and a six-month follow-up mammogram was being recommended.  REVIEW OF SYSTEMS:   Constitutional: Denies fevers, chills or abnormal weight loss Eyes: Denies blurriness of vision Ears, nose, mouth, throat, and face: Denies mucositis or sore throat Respiratory: Denies cough, dyspnea or wheezes Cardiovascular: Denies palpitation, chest discomfort Gastrointestinal:  Denies nausea, heartburn or change in bowel habits Skin: Denies abnormal skin rashes Lymphatics: Denies new lymphadenopathy or easy bruising Neurological:Denies numbness, tingling or new weaknesses Behavioral/Psych: Mood is stable, no new changes  Extremities: No  lower extremity edema Breast:  denies any pain or lumps or nodules in either breasts All other systems were reviewed with the patient and are negative.  I have reviewed the past medical history, past surgical history, social history and family history with the patient and they are unchanged from previous note.  ALLERGIES:  has No Known Allergies.  MEDICATIONS:  Current Outpatient Prescriptions  Medication Sig Dispense Refill  . atorvastatin (LIPITOR) 40 MG tablet Take 40 mg by mouth daily.      . Calcium Carbonate (CALCIUM 500 PO) Take 1,000 mg by mouth.      . diclofenac (VOLTAREN) 75 MG EC tablet Take 75 mg by mouth 2 (two) times daily with a meal.  0  . hydrochlorothiazide 25 MG tablet Take 25 mg by mouth daily.      . Multiple Vitamin (MULTIVITAMIN) tablet Take 1 tablet by mouth daily.      . pantoprazole (PROTONIX) 40 MG tablet Take 40 mg by mouth daily.    . potassium chloride SA (K-DUR,KLOR-CON) 20 MEQ tablet Take one daily with diuretic 90 tablet 3  . PRESCRIPTION MEDICATION Inject as directed once a week. B-12 injections    . tamoxifen (NOLVADEX) 20 MG tablet Take 1 tablet (20 mg total) by mouth daily. 90 tablet 3  . venlafaxine XR (EFFEXOR-XR) 75 MG 24 hr capsule Take 150 mg by mouth daily.    . vitamin D, CHOLECALCIFEROL, 400 UNITS tablet Take 1,000 Units by mouth daily.     . vitamin E 400 UNIT capsule Take 400 Units by mouth daily.      . Zinc 50 MG CAPS Take 1 capsule by mouth daily.      Marland Kitchen  zolpidem (AMBIEN) 5 MG tablet Take 5 mg by mouth at bedtime as needed.       No current facility-administered medications for this visit.     PHYSICAL EXAMINATION: ECOG PERFORMANCE STATUS: 1 - Symptomatic but completely ambulatory  Vitals:   05/23/16 0843  BP: 123/62  Pulse: 96  Resp: 18  Temp: 97.8 F (36.6 C)   Filed Weights   05/23/16 0843  Weight: 212 lb (96.2 kg)    GENERAL:alert, no distress and comfortable SKIN: skin color, texture, turgor are normal, no rashes or  significant lesions EYES: normal, Conjunctiva are pink and non-injected, sclera clear OROPHARYNX:no exudate, no erythema and lips, buccal mucosa, and tongue normal  NECK: supple, thyroid normal size, non-tender, without nodularity LYMPH:  no palpable lymphadenopathy in the cervical, axillary or inguinal LUNGS: clear to auscultation and percussion with normal breathing effort HEART: regular rate & rhythm and no murmurs and no lower extremity edema ABDOMEN:abdomen soft, non-tender and normal bowel sounds MUSCULOSKELETAL:no cyanosis of digits and no clubbing  NEURO: alert & oriented x 3 with fluent speech, no focal motor/sensory deficits EXTREMITIES: No lower extremity edema BREAST: No palpable masses or nodules in either right or left breasts. No palpable axillary supraclavicular or infraclavicular adenopathy no breast tenderness or nipple discharge. (exam performed in the presence of a chaperone)  LABORATORY DATA:  I have reviewed the data as listed   Chemistry      Component Value Date/Time   NA 143 05/23/2016 0820   K 3.6 05/23/2016 0820   CL 98 09/23/2014 1401   CL 99 07/03/2012 1525   CO2 28 05/23/2016 0820   BUN 11.8 05/23/2016 0820   CREATININE 0.8 05/23/2016 0820      Component Value Date/Time   CALCIUM 9.6 05/23/2016 0820   ALKPHOS 111 05/23/2016 0820   AST 29 05/23/2016 0820   ALT 35 05/23/2016 0820   BILITOT 1.12 05/23/2016 0820       Lab Results  Component Value Date   WBC 9.6 05/23/2016   HGB 13.4 05/23/2016   HCT 41.2 05/23/2016   MCV 92.6 05/23/2016   PLT 281 05/23/2016   NEUTROABS 6.1 05/23/2016    ASSESSMENT & PLAN:  DCIS (ductal carcinoma in situ)-right breast ER/PR positive status post lumpectomy, radiation, tamoxifen that started February 2012 completed February 2017  Tamoxifen toxicity evaluation: Patient experienced hot flashes and was prescribed Effexor which seem to be helping her but she continues to have some hot flashes  intermittently.  Surveillance: Today's breast exam does not reveal any lumps or nodules. I reviewed the mammograms that were done at [facility] on 02/17/2016 which showed coarse dystrophic calcifications in the right breast. A six-month follow-up mammogram is being recommended. If the repeat mammogram shows abnormalities or if the she has biopsies at our concerning then we would be happy to see her immediately.  Survivorship: Encouraged her to stay more active and do exercise and eat more fruits and vegetables and less red meat.  She is very concerned about her weight gain. She has gained about 10 pounds in the last 1-2 months. She plans to rejoin [employer] and lose weight that way. We discussed the role of vitamin D as well as to break and reducing breast cancer recurrence.  RTC as needed.   No orders of the defined types were placed in this encounter.  The patient has a good understanding of the overall plan. she agrees with it. she will call with any problems that may develop before  the next visit here.   Rulon Eisenmenger, MD 05/23/16

## 2016-05-23 NOTE — Progress Notes (Signed)
Called [facility] per md requset of pt last mammogram results. Spoke with Vaughan Basta and will fax over reports today.

## 2016-07-06 ENCOUNTER — Ambulatory Visit
Admission: RE | Admit: 2016-07-06 | Discharge: 2016-07-06 | Disposition: A | Discharge status: Home / Self Care | Payer: BLUE CROSS/BLUE SHIELD | Source: Ambulatory Visit | Attending: Internal Medicine | Admitting: Internal Medicine

## 2016-07-06 DIAGNOSIS — F17210 Nicotine dependence, cigarettes, uncomplicated: Secondary | ICD-10-CM

## 2017-08-08 ENCOUNTER — Other Ambulatory Visit: Payer: Self-pay | Admitting: Internal Medicine

## 2017-08-08 DIAGNOSIS — F17210 Nicotine dependence, cigarettes, uncomplicated: Secondary | ICD-10-CM

## 2017-09-04 ENCOUNTER — Ambulatory Visit: Payer: BLUE CROSS/BLUE SHIELD

## 2017-09-14 ENCOUNTER — Ambulatory Visit
Admission: RE | Admit: 2017-09-14 | Discharge: 2017-09-14 | Disposition: A | Discharge status: Home / Self Care | Payer: Medicare Other | Source: Ambulatory Visit | Attending: Internal Medicine | Admitting: Internal Medicine

## 2017-09-14 DIAGNOSIS — F17210 Nicotine dependence, cigarettes, uncomplicated: Secondary | ICD-10-CM

## 2018-08-29 ENCOUNTER — Other Ambulatory Visit: Payer: Self-pay | Admitting: Internal Medicine

## 2018-08-29 DIAGNOSIS — F17210 Nicotine dependence, cigarettes, uncomplicated: Secondary | ICD-10-CM

## 2018-09-09 IMAGING — CT CT CHEST LUNG CANCER SCREENING LOW DOSE W/O CM
1 of 2 series · 10 of 39 positions shown, 13 images · non-contrast
Comparison: Low-dose lung cancer screening CT chest dated
07/06/2016

CLINICAL DATA: 66-year-old female former smoker, quit 8 years ago,
with 30 pack-year history of smoking, for follow-up lung cancer
screening. History of right breast cancer status post lumpectomy in
6822.

EXAM:
CT CHEST WITHOUT CONTRAST LOW-DOSE FOR LUNG CANCER SCREENING
TECHNIQUE: Multidetector CT imaging of the chest was performed following the
standard protocol without IV contrast.

[ct lung segmentation data · axial · 0.72mm/px · z∈[-250,-250]mm · 10 of 250 frames shown]
[frame 1/250  mediastinal]
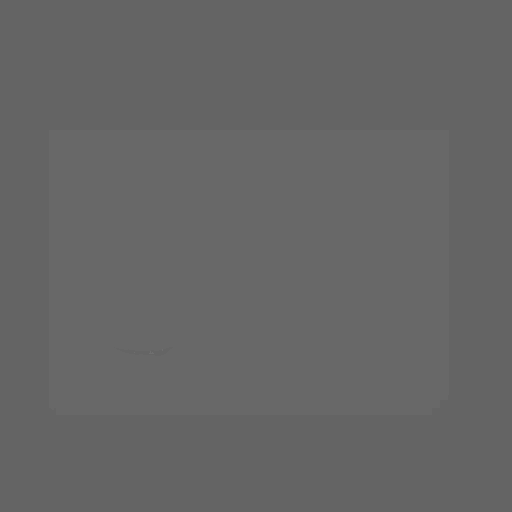
[frame 1/250  lung]
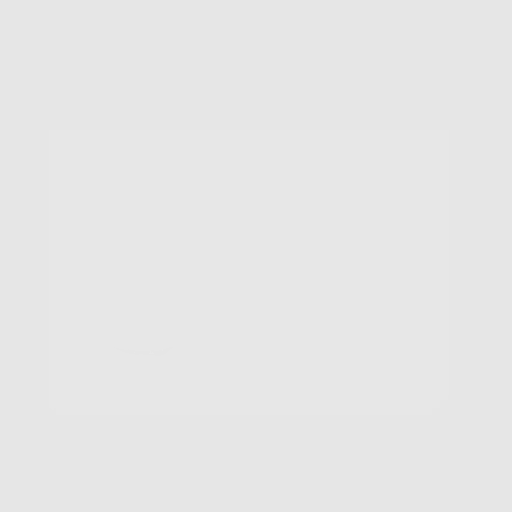
[frame 28/250  lung]
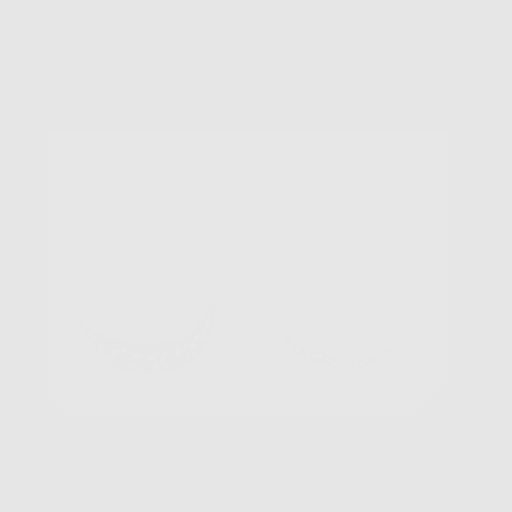
[frame 56/250  lung]
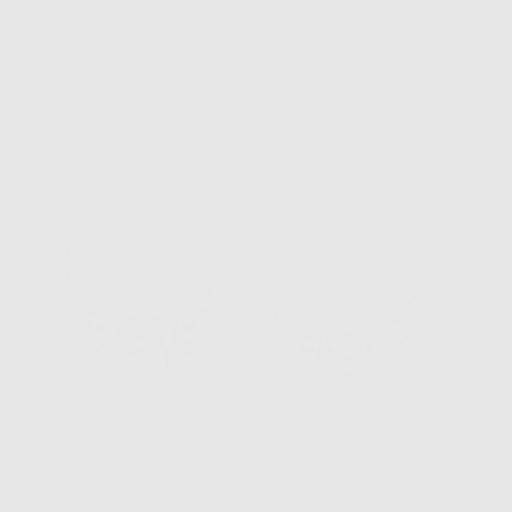
[frame 84/250  lung]
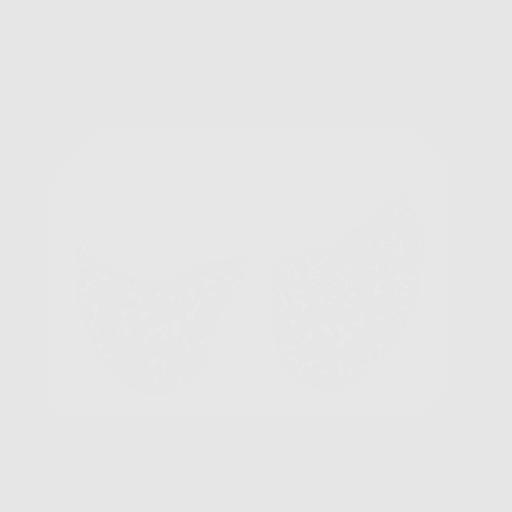
[frame 111/250  mediastinal]
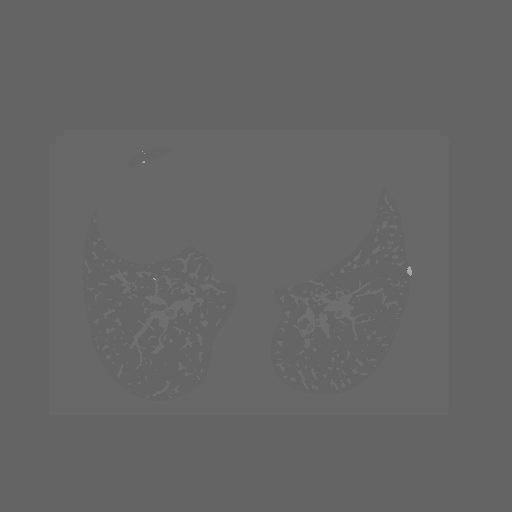
[frame 111/250  lung]
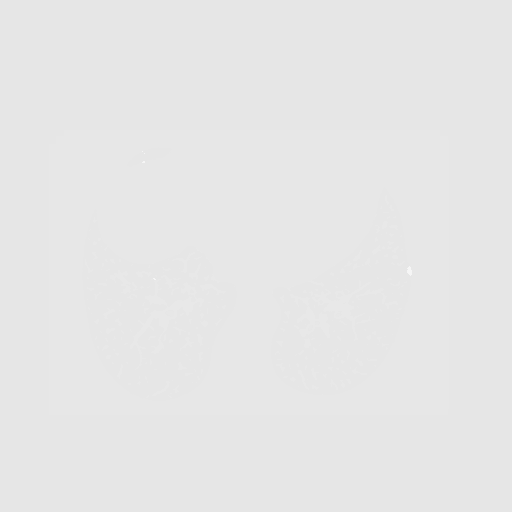
[frame 139/250  lung]
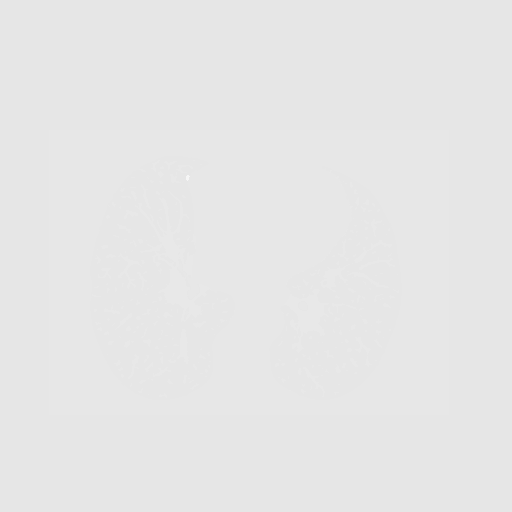
[frame 167/250  lung]
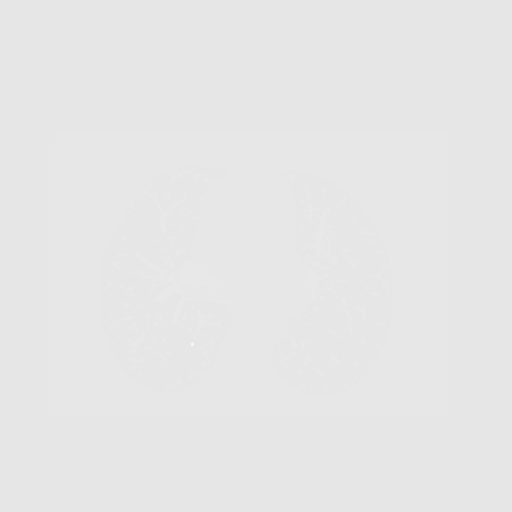
[frame 194/250  lung]
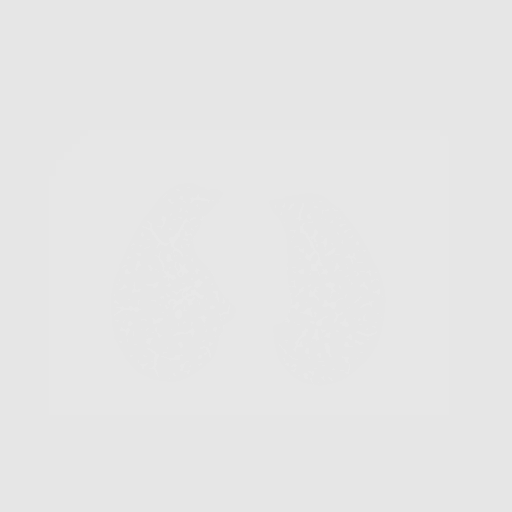
[frame 222/250  mediastinal]
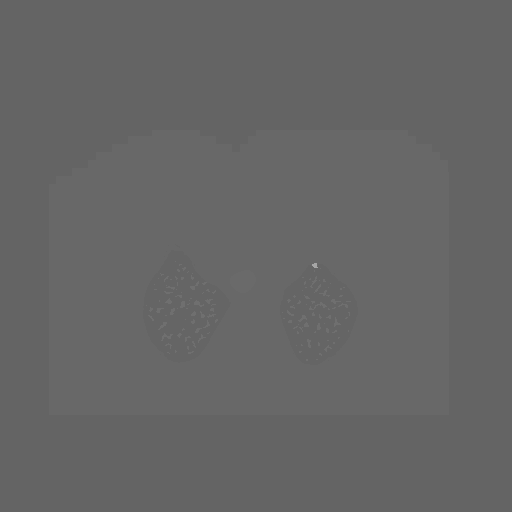
[frame 222/250  lung]
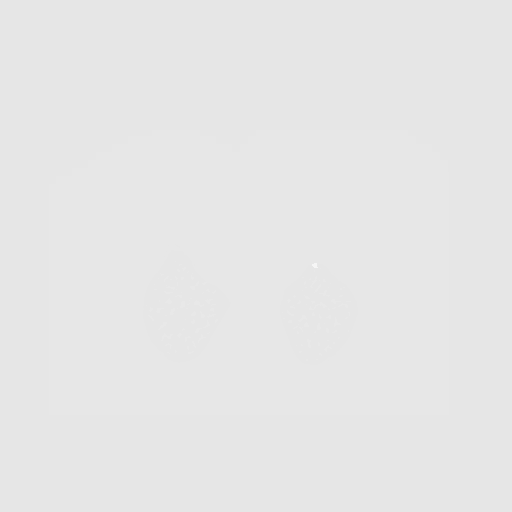
[frame 250/250  lung]
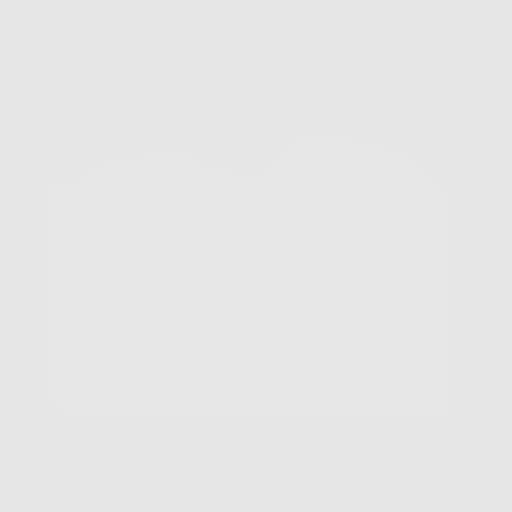

[10 of 39 positions shown; findings below may reference images not displayed]

FINDINGS: Cardiovascular: Heart is normal in size.  No pericardial effusion.

No evidence of thoracic aortic aneurysm. Mild atherosclerotic
calcifications of the aortic root/arch.

Mediastinum/Nodes: No suspicious mediastinal lymphadenopathy. Stable
9 mm short axis low right paratracheal node with preservation of the
normal fatty hilum.

Visualized thyroid is unremarkable.

Lungs/Pleura: No suspicious pulmonary nodules.

No focal consolidation.

Scattered benign calcified granulomata, measuring up to 4.2 mm in
the right upper lobe.

No suspicious pulmonary nodules.

No pleural effusion or pneumothorax.

Upper Abdomen: Visualized upper abdomen is notable for prior
cholecystectomy.

Musculoskeletal: Thoracolumbar spine is within normal limits.

Cervical spine fixation hardware, incompletely visualized.
IMPRESSION: Lung-RADS 1, negative. Continue annual screening with low-dose chest
CT without contrast in 12 months.

Aortic Atherosclerosis ([id number]).

## 2018-09-12 ENCOUNTER — Encounter (HOSPITAL_BASED_OUTPATIENT_CLINIC_OR_DEPARTMENT_OTHER): Payer: Self-pay | Admitting: *Deleted

## 2018-09-12 ENCOUNTER — Other Ambulatory Visit: Payer: Self-pay

## 2018-09-13 ENCOUNTER — Other Ambulatory Visit: Payer: Self-pay | Admitting: Orthopedic Surgery

## 2018-09-13 ENCOUNTER — Encounter (HOSPITAL_BASED_OUTPATIENT_CLINIC_OR_DEPARTMENT_OTHER)
Admission: RE | Admit: 2018-09-13 | Discharge: 2018-09-13 | Disposition: A | Discharge status: Home / Self Care | Payer: Medicare Other | Source: Ambulatory Visit | Attending: Orthopedic Surgery | Admitting: Orthopedic Surgery

## 2018-09-13 DIAGNOSIS — Z01812 Encounter for preprocedural laboratory examination: Secondary | ICD-10-CM | POA: Insufficient documentation

## 2018-09-13 LAB — BASIC METABOLIC PANEL
ANION GAP: 11 (ref 5–15)
BUN: 17 mg/dL (ref 8–23)
CO2: 25 mmol/L (ref 22–32)
Calcium: 9.6 mg/dL (ref 8.9–10.3)
Chloride: 103 mmol/L (ref 98–111)
Creatinine, Ser: 0.79 mg/dL (ref 0.44–1.00)
GFR calc Af Amer: >60 mL/min (ref >60)
GLUCOSE: 114 mg/dL — AB (ref 70–99)
POTASSIUM: 4.1 mmol/L (ref 3.5–5.1)
SODIUM: 139 mmol/L (ref 135–145)

## 2018-09-16 NOTE — Anesthesia Preprocedure Evaluation (Addendum)
Anesthesia Evaluation  Patient identified by MRN, date of birth, ID band Patient awake    Reviewed: Allergy & Precautions, NPO status , Patient's Chart, lab work & pertinent test results  Airway Mallampati: III  TM Distance: >3 FB Neck ROM: Full  Mouth opening: Limited Mouth Opening  Dental no notable dental hx. (+) Teeth Intact, Dental Advisory Given   Pulmonary neg pulmonary ROS, former smoker,    Pulmonary exam normal breath sounds clear to auscultation       Cardiovascular Normal cardiovascular exam Rhythm:Regular Rate:Normal     Neuro/Psych PSYCHIATRIC DISORDERS Depression negative neurological ROS     GI/Hepatic Neg liver ROS, GERD  Medicated and Controlled,  Endo/Other  negative endocrine ROS  Renal/GU negative Renal ROS  negative genitourinary   Musculoskeletal  (+) Arthritis ,   Abdominal   Peds  Hematology negative hematology ROS (+)   Anesthesia Other Findings Left achilles tendon rupture  Reproductive/Obstetrics                            Anesthesia Physical Anesthesia Plan  ASA: II  Anesthesia Plan: Regional and MAC   Post-op Pain Management:  Regional for Post-op pain   Induction: Intravenous  PONV Risk Score and Plan: 2 and Ondansetron, Dexamethasone, Midazolam and Propofol infusion  Airway Management Planned: Natural Airway and Simple Face Mask  Additional Equipment:   Intra-op Plan:   Post-operative Plan:   Informed Consent: I have reviewed the patients History and Physical, chart, labs and discussed the procedure including the risks, benefits and alternatives for the proposed anesthesia with the patient or authorized representative who has indicated his/her understanding and acceptance.     Dental advisory given  Plan Discussed with: CRNA  Anesthesia Plan Comments:        Anesthesia Quick Evaluation

## 2018-09-17 ENCOUNTER — Encounter (HOSPITAL_BASED_OUTPATIENT_CLINIC_OR_DEPARTMENT_OTHER): Payer: Self-pay

## 2018-09-17 ENCOUNTER — Other Ambulatory Visit: Payer: Self-pay

## 2018-09-17 ENCOUNTER — Ambulatory Visit (HOSPITAL_BASED_OUTPATIENT_CLINIC_OR_DEPARTMENT_OTHER): Payer: Medicare Other | Admitting: Anesthesiology

## 2018-09-17 ENCOUNTER — Ambulatory Visit (HOSPITAL_BASED_OUTPATIENT_CLINIC_OR_DEPARTMENT_OTHER): Admit: 2018-09-17 | Payer: Self-pay | Admitting: Orthopedic Surgery

## 2018-09-17 ENCOUNTER — Ambulatory Visit (HOSPITAL_BASED_OUTPATIENT_CLINIC_OR_DEPARTMENT_OTHER)
Admission: RE | Admit: 2018-09-17 | Discharge: 2018-09-17 | Disposition: A | Discharge status: Home / Self Care | Payer: Medicare Other | Attending: Orthopedic Surgery | Admitting: Orthopedic Surgery

## 2018-09-17 ENCOUNTER — Encounter (HOSPITAL_BASED_OUTPATIENT_CLINIC_OR_DEPARTMENT_OTHER)
Admission: RE | Disposition: A | Discharge status: Home / Self Care | Payer: Self-pay | Source: Home / Self Care | Attending: Orthopedic Surgery

## 2018-09-17 ENCOUNTER — Encounter (HOSPITAL_BASED_OUTPATIENT_CLINIC_OR_DEPARTMENT_OTHER): Payer: Self-pay | Admitting: *Deleted

## 2018-09-17 DIAGNOSIS — K219 Gastro-esophageal reflux disease without esophagitis: Secondary | ICD-10-CM | POA: Diagnosis not present

## 2018-09-17 DIAGNOSIS — Z86000 Personal history of in-situ neoplasm of breast: Secondary | ICD-10-CM | POA: Diagnosis not present

## 2018-09-17 DIAGNOSIS — Z87891 Personal history of nicotine dependence: Secondary | ICD-10-CM | POA: Diagnosis not present

## 2018-09-17 DIAGNOSIS — X58XXXA Exposure to other specified factors, initial encounter: Secondary | ICD-10-CM | POA: Insufficient documentation

## 2018-09-17 DIAGNOSIS — S86012A Strain of left Achilles tendon, initial encounter: Secondary | ICD-10-CM | POA: Diagnosis present

## 2018-09-17 DIAGNOSIS — F329 Major depressive disorder, single episode, unspecified: Secondary | ICD-10-CM | POA: Diagnosis not present

## 2018-09-17 DIAGNOSIS — E785 Hyperlipidemia, unspecified: Secondary | ICD-10-CM | POA: Insufficient documentation

## 2018-09-17 DIAGNOSIS — Z79899 Other long term (current) drug therapy: Secondary | ICD-10-CM | POA: Insufficient documentation

## 2018-09-17 HISTORY — DX: Strain of left Achilles tendon, initial encounter: S86.012A

## 2018-09-17 HISTORY — PX: ACHILLES TENDON SURGERY: SHX542

## 2018-09-17 SURGERY — TENDON REPAIR
Anesthesia: Choice | Laterality: Left

## 2018-09-17 SURGERY — REPAIR, TENDON, ACHILLES
Anesthesia: Monitor Anesthesia Care | Site: Leg Lower | Laterality: Left

## 2018-09-17 MED ORDER — PROPOFOL 500 MG/50ML IV EMUL
INTRAVENOUS | Status: DC | PRN
  Administered 2018-09-17: 100 ug/kg/min via INTRAVENOUS

## 2018-09-17 MED ORDER — DEXMEDETOMIDINE HCL IN NACL 200 MCG/50ML IV SOLN
INTRAVENOUS | Status: DC | PRN
  Administered 2018-09-17 (×2): 8 ug via INTRAVENOUS
  Administered 2018-09-17: 4 ug via INTRAVENOUS

## 2018-09-17 MED ORDER — BUPIVACAINE HCL (PF) 0.5 % IJ SOLN
INTRAMUSCULAR | Status: AC
  Filled 2018-09-17: qty 30

## 2018-09-17 MED ORDER — LIDOCAINE 2% (20 MG/ML) 5 ML SYRINGE
INTRAMUSCULAR | Status: AC
  Filled 2018-09-17: qty 5

## 2018-09-17 MED ORDER — FENTANYL CITRATE (PF) 100 MCG/2ML IJ SOLN
INTRAMUSCULAR | Status: AC
  Filled 2018-09-17: qty 2

## 2018-09-17 MED ORDER — CHLORHEXIDINE GLUCONATE 4 % EX LIQD: 60.0000 mL | Freq: Once | CUTANEOUS | Status: DC

## 2018-09-17 MED ORDER — CLONIDINE HCL (ANALGESIA) 100 MCG/ML EP SOLN
EPIDURAL | Status: DC | PRN
  Administered 2018-09-17: 100 ug

## 2018-09-17 MED ORDER — HYDROCODONE-ACETAMINOPHEN 5-325 MG PO TABS: 1.0000 | ORAL_TABLET | Freq: Four times a day (QID) | ORAL | 0 refills | Status: AC | PRN

## 2018-09-17 MED ORDER — ROCURONIUM BROMIDE 50 MG/5ML IV SOSY
PREFILLED_SYRINGE | INTRAVENOUS | Status: AC
  Filled 2018-09-17: qty 5

## 2018-09-17 MED ORDER — SUCCINYLCHOLINE CHLORIDE 200 MG/10ML IV SOSY
PREFILLED_SYRINGE | INTRAVENOUS | Status: AC
  Filled 2018-09-17: qty 10

## 2018-09-17 MED ORDER — ONDANSETRON HCL 4 MG PO TABS: 4.0000 mg | ORAL_TABLET | Freq: Three times a day (TID) | ORAL | 0 refills | Status: AC | PRN

## 2018-09-17 MED ORDER — CEFAZOLIN SODIUM-DEXTROSE 2-4 GM/100ML-% IV SOLN
INTRAVENOUS | Status: AC
  Filled 2018-09-17: qty 100

## 2018-09-17 MED ORDER — BUPIVACAINE HCL (PF) 0.5 % IJ SOLN
INTRAMUSCULAR | Status: DC | PRN
  Administered 2018-09-17: 25 mL via PERINEURAL

## 2018-09-17 MED ORDER — LACTATED RINGERS IV SOLN
INTRAVENOUS | Status: DC
  Administered 2018-09-17 (×2): via INTRAVENOUS

## 2018-09-17 MED ORDER — MIDAZOLAM HCL 2 MG/2ML IJ SOLN
INTRAMUSCULAR | Status: AC
  Filled 2018-09-17: qty 2

## 2018-09-17 MED ORDER — PROPOFOL 10 MG/ML IV BOLUS
INTRAVENOUS | Status: DC | PRN
  Administered 2018-09-17: 20 mg via INTRAVENOUS
  Administered 2018-09-17: 10 mg via INTRAVENOUS

## 2018-09-17 MED ORDER — BUPIVACAINE HCL (PF) 0.25 % IJ SOLN
INTRAMUSCULAR | Status: AC
  Filled 2018-09-17: qty 60

## 2018-09-17 MED ORDER — FENTANYL CITRATE (PF) 100 MCG/2ML IJ SOLN
50.0000 ug | INTRAMUSCULAR | Status: DC | PRN
  Administered 2018-09-17: 50 ug via INTRAVENOUS

## 2018-09-17 MED ORDER — FENTANYL CITRATE (PF) 100 MCG/2ML IJ SOLN: 25.0000 ug | INTRAMUSCULAR | Status: DC | PRN

## 2018-09-17 MED ORDER — DEXAMETHASONE SODIUM PHOSPHATE 10 MG/ML IJ SOLN
INTRAMUSCULAR | Status: AC
  Filled 2018-09-17: qty 1

## 2018-09-17 MED ORDER — MIDAZOLAM HCL 2 MG/2ML IJ SOLN
1.0000 mg | INTRAMUSCULAR | Status: DC | PRN
  Administered 2018-09-17: 2 mg via INTRAVENOUS

## 2018-09-17 MED ORDER — BACLOFEN 10 MG PO TABS: 10.0000 mg | ORAL_TABLET | Freq: Three times a day (TID) | ORAL | 0 refills | Status: AC

## 2018-09-17 MED ORDER — ACETAMINOPHEN 500 MG PO TABS
1000.0000 mg | ORAL_TABLET | Freq: Once | ORAL | Status: AC
  Administered 2018-09-17: 1000 mg via ORAL

## 2018-09-17 MED ORDER — ACETAMINOPHEN 500 MG PO TABS
ORAL_TABLET | ORAL | Status: AC
  Filled 2018-09-17: qty 2

## 2018-09-17 MED ORDER — CEFAZOLIN SODIUM-DEXTROSE 2-4 GM/100ML-% IV SOLN
2.0000 g | INTRAVENOUS | Status: AC
  Administered 2018-09-17: 2 g via INTRAVENOUS

## 2018-09-17 MED ORDER — KETAMINE HCL 100 MG/ML IJ SOLN
INTRAMUSCULAR | Status: DC | PRN
  Administered 2018-09-17: 30 mg via INTRAVENOUS
  Administered 2018-09-17: 10 mg via INTRAVENOUS
  Administered 2018-09-17: 20 mg via INTRAVENOUS

## 2018-09-17 MED ORDER — ONDANSETRON HCL 4 MG/2ML IJ SOLN
INTRAMUSCULAR | Status: AC
  Filled 2018-09-17: qty 2

## 2018-09-17 MED ORDER — ONDANSETRON HCL 4 MG/2ML IJ SOLN
INTRAMUSCULAR | Status: DC | PRN
  Administered 2018-09-17: 4 mg via INTRAVENOUS

## 2018-09-17 MED ORDER — SENNA-DOCUSATE SODIUM 8.6-50 MG PO TABS: 2.0000 | ORAL_TABLET | Freq: Every day | ORAL | 1 refills | Status: AC

## 2018-09-17 MED ORDER — KETAMINE HCL 100 MG/ML IJ SOLN
INTRAMUSCULAR | Status: AC
  Filled 2018-09-17: qty 1

## 2018-09-17 MED ORDER — LIDOCAINE 2% (20 MG/ML) 5 ML SYRINGE
INTRAMUSCULAR | Status: DC | PRN
  Administered 2018-09-17: 40 mg via INTRAVENOUS

## 2018-09-17 MED ORDER — SCOPOLAMINE 1 MG/3DAYS TD PT72: 1.0000 | MEDICATED_PATCH | Freq: Once | TRANSDERMAL | Status: DC | PRN

## 2018-09-17 MED ORDER — PHENYLEPHRINE 40 MCG/ML (10ML) SYRINGE FOR IV PUSH (FOR BLOOD PRESSURE SUPPORT)
PREFILLED_SYRINGE | INTRAVENOUS | Status: AC
  Filled 2018-09-17: qty 10

## 2018-09-17 MED ORDER — PROPOFOL 500 MG/50ML IV EMUL
INTRAVENOUS | Status: AC
  Filled 2018-09-17: qty 50

## 2018-09-17 SURGICAL SUPPLY — 68 items
ANCH SUT SWLK 19.1X4.75 (Anchor) ×1 IMPLANT
ANCHOR SUT BIO SW 4.75X19.1 (Anchor) ×1 IMPLANT
BANDAGE ACE 4X5 VEL STRL LF (GAUZE/BANDAGES/DRESSINGS) ×2 IMPLANT
BANDAGE ACE 6X5 VEL STRL LF (GAUZE/BANDAGES/DRESSINGS) ×2 IMPLANT
BANDAGE ESMARK 6X9 LF (GAUZE/BANDAGES/DRESSINGS) ×1 IMPLANT
BLADE SURG 15 STRL LF DISP TIS (BLADE) ×3 IMPLANT
BLADE SURG 15 STRL SS (BLADE) ×6
BNDG CMPR 9X6 STRL LF SNTH (GAUZE/BANDAGES/DRESSINGS) ×1
BNDG COHESIVE 4X5 TAN STRL (GAUZE/BANDAGES/DRESSINGS) ×2 IMPLANT
BNDG ESMARK 6X9 LF (GAUZE/BANDAGES/DRESSINGS) ×2
CANISTER SUCT 1200ML W/VALVE (MISCELLANEOUS) ×2 IMPLANT
CHLORAPREP W/TINT 10.5 ML (MISCELLANEOUS) ×2 IMPLANT
CLSR STERI-STRIP ANTIMIC 1/2X4 (GAUZE/BANDAGES/DRESSINGS) IMPLANT
COVER BACK TABLE 60X90IN (DRAPES) ×2 IMPLANT
COVER WAND RF STERILE (DRAPES) IMPLANT
CUFF TOURN SGL QUICK 34 (TOURNIQUET CUFF) ×2
CUFF TRNQT CYL 34X4.125X (TOURNIQUET CUFF) IMPLANT
DECANTER SPIKE VIAL GLASS SM (MISCELLANEOUS) IMPLANT
DRAPE EXTREMITY T 121X128X90 (DISPOSABLE) ×2 IMPLANT
DRAPE IMP U-DRAPE 54X76 (DRAPES) ×3 IMPLANT
DRAPE U-SHAPE 47X51 STRL (DRAPES) ×2 IMPLANT
DRSG EMULSION OIL 3X3 NADH (GAUZE/BANDAGES/DRESSINGS) ×1 IMPLANT
DRSG PAD ABDOMINAL 8X10 ST (GAUZE/BANDAGES/DRESSINGS) ×2 IMPLANT
DURAPREP 26ML APPLICATOR (WOUND CARE) ×1 IMPLANT
ELECT REM PT RETURN 9FT ADLT (ELECTROSURGICAL) ×2
ELECTRODE REM PT RTRN 9FT ADLT (ELECTROSURGICAL) ×1 IMPLANT
GAUZE SPONGE 4X4 12PLY STRL (GAUZE/BANDAGES/DRESSINGS) ×2 IMPLANT
GLOVE BIO SURGEON STRL SZ7.5 (GLOVE) ×1 IMPLANT
GLOVE BIOGEL PI IND STRL 7.0 (GLOVE) IMPLANT
GLOVE BIOGEL PI IND STRL 8 (GLOVE) ×2 IMPLANT
GLOVE BIOGEL PI INDICATOR 7.0 (GLOVE) ×3
GLOVE BIOGEL PI INDICATOR 8 (GLOVE) ×3
GLOVE ECLIPSE 6.5 STRL STRAW (GLOVE) ×1 IMPLANT
GLOVE ORTHO TXT STRL SZ7.5 (GLOVE) ×2 IMPLANT
GLOVE SURG ORTHO 8.0 STRL STRW (GLOVE) ×3 IMPLANT
GOWN STRL REUS W/ TWL LRG LVL3 (GOWN DISPOSABLE) ×1 IMPLANT
GOWN STRL REUS W/ TWL XL LVL3 (GOWN DISPOSABLE) ×2 IMPLANT
GOWN STRL REUS W/TWL LRG LVL3 (GOWN DISPOSABLE) ×3 IMPLANT
GOWN STRL REUS W/TWL XL LVL3 (GOWN DISPOSABLE) ×4
NDL HYPO 25X1 1.5 SAFETY (NEEDLE) IMPLANT
NDL SUT 6 .5 CRC .975X.05 MAYO (NEEDLE) IMPLANT
NEEDLE HYPO 25X1 1.5 SAFETY (NEEDLE) IMPLANT
NEEDLE MAYO TAPER (NEEDLE) ×2
NS IRRIG 1000ML POUR BTL (IV SOLUTION) ×2 IMPLANT
PACK BASIN DAY SURGERY FS (CUSTOM PROCEDURE TRAY) ×2 IMPLANT
PAD CAST 4YDX4 CTTN HI CHSV (CAST SUPPLIES) ×2 IMPLANT
PADDING CAST COTTON 4X4 STRL (CAST SUPPLIES) ×4
PENCIL BUTTON HOLSTER BLD 10FT (ELECTRODE) ×2 IMPLANT
SLEEVE SCD COMPRESS KNEE MED (MISCELLANEOUS) ×3 IMPLANT
SPLINT FAST PLASTER 5X30 (CAST SUPPLIES) ×20
SPLINT PLASTER CAST FAST 5X30 (CAST SUPPLIES) IMPLANT
SPONGE LAP 4X18 RFD (DISPOSABLE) ×2 IMPLANT
STOCKINETTE 6  STRL (DRAPES)
STOCKINETTE 6 STRL (DRAPES) ×1 IMPLANT
SUCTION FRAZIER HANDLE 10FR (MISCELLANEOUS) ×1
SUCTION TUBE FRAZIER 10FR DISP (MISCELLANEOUS) ×1 IMPLANT
SUT ETHILON 3 0 PS 1 (SUTURE) ×3 IMPLANT
SUT FIBERWIRE #2 38 T-5 BLUE (SUTURE)
SUT MNCRL AB 4-0 PS2 18 (SUTURE) IMPLANT
SUT VICRYL 3-0 CR8 SH (SUTURE) ×2 IMPLANT
SUTURE FIBERWR #2 38 T-5 BLUE (SUTURE) IMPLANT
SUTURE TAPE 1.3 40 TPR END (SUTURE) IMPLANT
SUTURETAPE 1.3 40 TPR END (SUTURE) ×6
SYR BULB 3OZ (MISCELLANEOUS) ×2 IMPLANT
SYR CONTROL 10ML LL (SYRINGE) IMPLANT
TUBE CONNECTING 20X1/4 (TUBING) ×1 IMPLANT
UNDERPAD 30X30 (UNDERPADS AND DIAPERS) ×1 IMPLANT
YANKAUER SUCT BULB TIP NO VENT (SUCTIONS) IMPLANT

## 2018-09-17 NOTE — Transfer of Care (Signed)
Immediate Anesthesia Transfer of Care Note  Patient: [name]  Procedure(s) Performed: ACHILLES TENDON REPAIR (Left Leg Lower)  Patient Location: PACU  Anesthesia Type:MAC combined with regional for post-op pain  Level of Consciousness: drowsy and patient cooperative  Airway & Oxygen Therapy: Patient Spontanous Breathing and Patient connected to face mask oxygen  Post-op Assessment: Report given to RN and Post -op Vital signs reviewed and stable  Post vital signs: Reviewed and stable  Last Vitals:  Vitals Value Taken Time  BP    Temp    Pulse 87 09/17/2018  9:23 AM  Resp 14 09/17/2018  9:23 AM  SpO2 99 % 09/17/2018  9:23 AM  Vitals shown include unvalidated device data.  Last Pain:  Vitals:   09/17/18 0644  TempSrc: Oral  PainSc: 0-No pain         Complications: No apparent anesthesia complications

## 2018-09-17 NOTE — Progress Notes (Signed)
Assisted D. Woodrum with left, ultrasound guided, popliteal block. Side rails up, monitors on throughout procedure. See vital signs in flow sheet. Tolerated Procedure well.

## 2018-09-17 NOTE — Op Note (Signed)
09/17/2018  8:51 AM  PATIENT:  [name]    PRE-OPERATIVE DIAGNOSIS:  LEFT ACCILLIES TENDON RUPTURE  POST-OPERATIVE DIAGNOSIS:  Same  PROCEDURE: Left Achilles tendon repair  SURGEON:  Marchia Bond, MD  PHYSICIAN ASSISTANT: HC Martensen, PA-C, present and scrubbed throughout the case, critical for completion in a timely fashion, and for retraction, instrumentation, and closure.  ANESTHESIA:   Regional  UNIQUE ASPECTS OF THE CASE: There was still a substantial amount of tendon present connected to the calcaneus, this was on the volar surface.  The dorsal portion of the tendon had delaminated and pulled off, but there was no really exposed bone.  This made the configuration of the tear quite unusual.  I ended up bringing a tendon to tendon repair for the volar side, and then brought the dorsal side into a swivel lock, although the proximal limb did not fully reach the bone, but it did reach the tendon, and was reduced to its volar segment.  ESTIMATED BLOOD LOSS: Minimal  PREOPERATIVE INDICATIONS:  [name] is a  68 y.o. female with a diagnosis of achilles tendon rupture who elected for surgical management.    The risks benefits and alternatives were discussed with the patient preoperatively including but not limited to the option of closed treatment, the risks of infection, bleeding, nerve injury, cardiopulmonary complications, the need for revision surgery, repeat rupture, stiffness, skin breakdown with wound healing complications, among others, and the patient was willing to proceed.   OPERATIVE IMPLANTS: #2 suture tape with 4 crossing strands tied together on the volar surface, and I took 1 of those limbs from each suture through the dorsal side, in addition to an additional #2 suture tape, bringing the remaining 4 strands on the dorsal side down into the swivel lock into the calcaneus.  OPERATIVE FINDINGS: The Achilles tendon was completely torn.  This was delaminated as  indicated above with a segment that was still present on the calcaneus side. She had an abnormal Thompson test.  OPERATIVE PROCEDURE: The patient was brought to the operating room and placed in supine position. Antibiotics were given.  Regional anesthesia was administered. She was turned prone and the lower extremity was prepped and draped in the usual sterile fashion.  Initially, there was a small piece of mud that was still present on the heel, this was removed, and then I reprepped and draped.  The leg was elevated and exsanguinated and the tourniquet was inflated. A posterior medial incision was performed, taking care to protect the sural nerve. The epitendinous tissue was dissected away using sharp dissection in order to minimize soft tissue trauma. The peritenon was incised sharply and then the superficial layer dissected away. The tendon itself was shredded, and I cleaned the tendinous edges, and prepared them for repair.  I placed a total of 2 #2 suture tape in a running interrupted Krakw type fashion up and down the tendon, one on the distal segment volar, 1 on the proximal segment volar, estimating the reduction distance for the delaminated split.   I then placed a third #2 suture tape centrally, in preparation for the anchor suture.    I then tied the volar sutures, and then cut one limb from each, and passed the other limb through the proximal tendon.    I then prepared the calcaneal suture anchor using a guidepin, 4 mm reamer, tapped, placing the entrance at the dorsal proximal angle of the calcaneal tuberosity.  I took all 4 limbs from the  proximal tendon, and brought it down into the anchor and secured the anchor completely.  Excellent fixation was achieved.    I irrigated the wounds copiously and repaired the peritenon with 3-0 Vicryl proximally. Distally it did not have the excursion to adequately close.  The skin was closed with nylon suture.  A posterior splint was applied with the  foot in slight plantarflexion. The tourniquet was released.  The patient was awakened and returned to the PACU in stable and satisfactory condition. There no complications and She tolerated the procedure well.

## 2018-09-17 NOTE — Anesthesia Procedure Notes (Signed)
Anesthesia Regional Block: Popliteal block   Pre-Anesthetic Checklist: ,, timeout performed, Correct Patient, Correct Site, Correct Laterality, Correct Procedure, Correct Position, site marked, Risks and benefits discussed,  Surgical consent,  Pre-op evaluation,  At surgeon's request and post-op pain management  Laterality: Left  Prep: Maximum Sterile Barrier Precautions used, chloraprep       Needles:  Injection technique: Single-shot  Needle Type: Echogenic Stimulator Needle     Needle Length: 9cm  Needle Gauge: 22     Additional Needles:   Procedures:,,,, ultrasound used (permanent image in chart),,,,  Narrative:  Start time: 09/17/2018 7:08 AM End time: 09/17/2018 7:18 AM Injection made incrementally with aspirations every 5 mL.  Performed by: Personally  Anesthesiologist: Freddrick March, MD  Additional Notes: Monitors applied. No increased pain on injection. No increased resistance to injection. Injection made in 5cc increments. Good needle visualization. Patient tolerated procedure well.

## 2018-09-17 NOTE — Discharge Instructions (Signed)
1,000mg  Tylenol taken at 6:55am - no Tylenol until 1:00pm if needed  Diet: As you were doing prior to hospitalization   Shower:  May shower but keep the wounds dry, use an occlusive plastic wrap, NO SOAKING IN TUB.  If the bandage gets wet, change with a clean dry gauze.  If you have a splint on, leave the splint in place and keep the splint dry with a plastic bag.  Dressing:  You may change your dressing 3-5 days after surgery, unless you have a splint.  If you have a splint, then just leave the splint in place and we will change your bandages during your first follow-up appointment.    If you had hand or foot surgery, we will plan to remove your stitches in about 2 weeks in the office.  For all other surgeries, there are sticky tapes (steri-strips) on your wounds and all the stitches are absorbable.  Leave the steri-strips in place when changing your dressings, they will peel off with time, usually 2-3 weeks.  Activity:  Increase activity slowly as tolerated, but follow the weight bearing instructions below.  The rules on driving is that you can not be taking narcotics while you drive, and you must feel in control of the vehicle.    Weight Bearing:   Touch toe weight bearing, try not to put weight on the foot.    To prevent constipation: you may use a stool softener such as -  Colace (over the counter) 100 mg by mouth twice a day  Drink plenty of fluids (prune juice may be helpful) and high fiber foods Miralax (over the counter) for constipation as needed.    Itching:  If you experience itching with your medications, try taking only a single pain pill, or even half a pain pill at a time.  You may take up to 10 pain pills per day, and you can also use benadryl over the counter for itching or also to help with sleep.   Precautions:  If you experience chest pain or shortness of breath - call 911 immediately for transfer to the hospital emergency department!!  If you develop a fever greater  that 101 F, purulent drainage from wound, increased redness or drainage from wound, or calf pain -- Call the office at [phone]                                                Follow- Up Appointment:  Please call for an appointment to be seen in 2 weeks [place] - [phone]   Post Anesthesia Home Care Instructions  Activity: Get plenty of rest for the remainder of the day. A responsible individual must stay with you for 24 hours following the procedure.  For the next 24 hours, DO NOT: -Drive a car -Paediatric nurse -Drink alcoholic beverages -Take any medication unless instructed by your physician -Make any legal decisions or sign important papers.  Meals: Start with liquid foods such as gelatin or soup. Progress to regular foods as tolerated. Avoid greasy, spicy, heavy foods. If nausea and/or vomiting occur, drink only clear liquids until the nausea and/or vomiting subsides. Call your physician if vomiting continues.  Special Instructions/Symptoms: Your throat may feel dry or sore from the anesthesia or the breathing tube placed in your throat during surgery. If this causes discomfort, gargle with warm salt water. The  discomfort should disappear within 24 hours.  If you had a scopolamine patch placed behind your ear for the management of post- operative nausea and/or vomiting:  1. The medication in the patch is effective for 72 hours, after which it should be removed.  Wrap patch in a tissue and discard in the trash. Wash hands thoroughly with soap and water. 2. You may remove the patch earlier than 72 hours if you experience unpleasant side effects which may include dry mouth, dizziness or visual disturbances. 3. Avoid touching the patch. Wash your hands with soap and water after contact with the patch.    Regional Anesthesia Blocks  1. Numbness or the inability to move the "blocked" extremity may last from 3-48 hours after placement. The length of time depends on the  medication injected and your individual response to the medication. If the numbness is not going away after 48 hours, call your surgeon.  2. The extremity that is blocked will need to be protected until the numbness is gone and the  Strength has returned. Because you cannot feel it, you will need to take extra care to avoid injury. Because it may be weak, you may have difficulty moving it or using it. You may not know what position it is in without looking at it while the block is in effect.  3. For blocks in the legs and feet, returning to weight bearing and walking needs to be done carefully. You will need to wait until the numbness is entirely gone and the strength has returned. You should be able to move your leg and foot normally before you try and bear weight or walk. You will need someone to be with you when you first try to ensure you do not fall and possibly risk injury.  4. Bruising and tenderness at the needle site are common side effects and will resolve in a few days.  5. Persistent numbness or new problems with movement should be communicated to the surgeon or the [place] [phone] [place] [phone]).

## 2018-09-17 NOTE — Anesthesia Postprocedure Evaluation (Signed)
Anesthesia Post Note  Patient: [name]  Procedure(s) Performed: ACHILLES TENDON REPAIR (Left Leg Lower)     Patient location during evaluation: PACU Anesthesia Type: Regional and MAC Level of consciousness: awake and alert Pain management: pain level controlled Vital Signs Assessment: post-procedure vital signs reviewed and stable Respiratory status: spontaneous breathing, nonlabored ventilation, respiratory function stable and patient connected to nasal cannula oxygen Cardiovascular status: stable and blood pressure returned to baseline Postop Assessment: no apparent nausea or vomiting Anesthetic complications: no    Last Vitals:  Vitals:   09/17/18 1000 09/17/18 1015  BP: 104/79 (!) 110/57  Pulse: 87 74  Resp: 16 18  Temp:  36.7 C  SpO2: 98% 100%    Last Pain:  Vitals:   09/17/18 1015  TempSrc:   PainSc: 0-No pain                 Chelsey L Woodrum

## 2018-09-17 NOTE — H&P (Signed)
PREOPERATIVE H&P  Chief Complaint: Left leg pain  HPI: [name] is a 68 y.o. female who presents for preoperative history and physical with a diagnosis of left Achilles rupture. Symptoms are rated as moderate to severe, and have been worsening.  This is significantly impairing activities of daily living.  She has elected for surgical management.  Injury occurred approximately 2 weeks ago, she felt a pop, had immediately pain and swelling in the back of her heel, unable to bear weight, weakness with ambulation.  Past Medical History:  Diagnosis Date  . Allergic rhinitis   . Arthritis    Right Hip  . Cancer ([place])   . 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 Depression   . GERD (gastroesophageal reflux disease)   . Hyperlipidemia   . Menopause    Past Surgical History:  Procedure Laterality Date  . ABDOMINAL HYSTERECTOMY  05/1993  . ANTERIOR CERVICAL DECOMP/DISCECTOMY FUSION  1999  . BREAST REDUCTION SURGERY  03/24/11   bilateral   . BREAST SURGERY    . CHOLECYSTECTOMY  08/2005  . OOPHORECTOMY  1993   one side   Social History   Socioeconomic History  . Marital status: Married    Spouse name: Marya Amsler  . Number of children: 0  . Years of education: 2y clg  . Highest education level: Not on file  Occupational History  . Occupation: Lobbyist: [employer]  Social Needs  . Financial resource strain: Not on file  . Food insecurity:    Worry: Not on file    Inability: Not on file  . Transportation needs:    Medical: Not on file    Non-medical: Not on file  Tobacco Use  . Smoking status: Former Smoker    Packs/day: 1.00    Years: 30.00    Pack years: 30.00    Last attempt to quit: 11/01/2008    Years since quitting: 9.8  . Smokeless tobacco: Never Used  Substance and Sexual Activity  . Alcohol use: No  . Drug use: No  . Sexual activity: Not Currently    Partners: Male  Lifestyle  . Physical activity:    Days per  week: Not on file    Minutes per session: Not on file  . Stress: Not on file  Relationships  . Social connections:    Talks on phone: Not on file    Gets together: Not on file    Attends religious service: Not on file    Active member of club or organization: Not on file    Attends meetings of clubs or organizations: Not on file    Relationship status: Not on file  Other Topics Concern  . Not on file  Social History Narrative  . Not on file   Family History  Problem Relation Age of Onset  . Cancer Father        colon   Allergies  Allergen Reactions  . Aspirin     Nausea    Prior to Admission medications   Medication Sig Start Date End Date Taking? Authorizing Provider  atorvastatin (LIPITOR) 40 MG tablet Take 40 mg by mouth daily.     Yes [provider]  Calcium Carbonate (CALCIUM 500 PO) Take 1,000 mg by mouth.     Yes [provider]  hydrochlorothiazide 25 MG tablet Take 25 mg by mouth daily.     Yes [provider]  Multiple  Vitamin (MULTIVITAMIN) tablet Take 1 tablet by mouth daily.     Yes [provider]  pantoprazole (PROTONIX) 40 MG tablet Take 40 mg by mouth daily.   Yes [provider]  venlafaxine XR (EFFEXOR-XR) 75 MG 24 hr capsule Take 150 mg by mouth daily. 07/07/13  Yes Marcy Panning, MD  vitamin D, CHOLECALCIFEROL, 400 UNITS tablet Take 1,000 Units by mouth daily.    Yes [provider]  vitamin E 400 UNIT capsule Take 400 Units by mouth daily.     Yes [provider]  zolpidem (AMBIEN) 5 MG tablet Take 5 mg by mouth at bedtime as needed.     Yes [provider]     Positive ROS: All other systems have been reviewed and were otherwise negative with the exception of those mentioned in the HPI and as above.  Physical Exam: General: Alert, no acute distress Cardiovascular: No pedal edema Respiratory: No cyanosis, no use of accessory musculature GI: No organomegaly, abdomen is soft and  non-tender Skin: No lesions in the area of chief complaint Neurologic: Sensation intact distally Psychiatric: Patient is competent for consent with normal mood and affect Lymphatic: No axillary or cervical lymphadenopathy  MUSCULOSKELETAL: Left foot has sensation intact throughout, plantar flexion is very weak, absent Thompson test.  Assessment: Left Achilles rupture   Plan: Plan for Procedure(s): ACHILLES TENDON REPAIR  The risks benefits and alternatives were discussed with the patient including but not limited to the risks of nonoperative treatment, versus surgical intervention including infection, bleeding, nerve injury,  blood clots, cardiopulmonary complications, morbidity, mortality, among others, and they were willing to proceed.  We also discussed the risks for wound breakdown, incomplete restoration of strength, recurrent tear, scar prominence, among others.    Johnny Bridge, MD Cell [phone]   09/17/2018 7:24 AM

## 2018-09-18 ENCOUNTER — Encounter (HOSPITAL_BASED_OUTPATIENT_CLINIC_OR_DEPARTMENT_OTHER): Payer: Self-pay | Admitting: Orthopedic Surgery

## 2020-02-25 ENCOUNTER — Encounter (INDEPENDENT_AMBULATORY_CARE_PROVIDER_SITE_OTHER): Payer: Self-pay

## 2020-07-26 DIAGNOSIS — E7800 Pure hypercholesterolemia, unspecified: Secondary | ICD-10-CM | POA: Diagnosis not present

## 2020-07-26 DIAGNOSIS — K219 Gastro-esophageal reflux disease without esophagitis: Secondary | ICD-10-CM | POA: Diagnosis not present

## 2020-07-26 DIAGNOSIS — Z853 Personal history of malignant neoplasm of breast: Secondary | ICD-10-CM | POA: Diagnosis not present

## 2020-07-26 DIAGNOSIS — I10 Essential (primary) hypertension: Secondary | ICD-10-CM | POA: Diagnosis not present

## 2020-07-26 DIAGNOSIS — D0511 Intraductal carcinoma in situ of right breast: Secondary | ICD-10-CM | POA: Diagnosis not present

## 2020-07-26 DIAGNOSIS — M169 Osteoarthritis of hip, unspecified: Secondary | ICD-10-CM | POA: Diagnosis not present

## 2020-08-23 DIAGNOSIS — M169 Osteoarthritis of hip, unspecified: Secondary | ICD-10-CM | POA: Diagnosis not present

## 2020-08-23 DIAGNOSIS — K219 Gastro-esophageal reflux disease without esophagitis: Secondary | ICD-10-CM | POA: Diagnosis not present

## 2020-08-23 DIAGNOSIS — E7800 Pure hypercholesterolemia, unspecified: Secondary | ICD-10-CM | POA: Diagnosis not present

## 2020-08-23 DIAGNOSIS — I10 Essential (primary) hypertension: Secondary | ICD-10-CM | POA: Diagnosis not present

## 2020-08-23 DIAGNOSIS — D0511 Intraductal carcinoma in situ of right breast: Secondary | ICD-10-CM | POA: Diagnosis not present

## 2020-08-23 DIAGNOSIS — Z853 Personal history of malignant neoplasm of breast: Secondary | ICD-10-CM | POA: Diagnosis not present

## 2020-10-21 DIAGNOSIS — I10 Essential (primary) hypertension: Secondary | ICD-10-CM | POA: Diagnosis not present

## 2020-10-21 DIAGNOSIS — K219 Gastro-esophageal reflux disease without esophagitis: Secondary | ICD-10-CM | POA: Diagnosis not present

## 2020-10-21 DIAGNOSIS — E7800 Pure hypercholesterolemia, unspecified: Secondary | ICD-10-CM | POA: Diagnosis not present

## 2020-12-02 DIAGNOSIS — Z853 Personal history of malignant neoplasm of breast: Secondary | ICD-10-CM | POA: Diagnosis not present

## 2020-12-02 DIAGNOSIS — K219 Gastro-esophageal reflux disease without esophagitis: Secondary | ICD-10-CM | POA: Diagnosis not present

## 2020-12-02 DIAGNOSIS — E7800 Pure hypercholesterolemia, unspecified: Secondary | ICD-10-CM | POA: Diagnosis not present

## 2020-12-02 DIAGNOSIS — R7303 Prediabetes: Secondary | ICD-10-CM | POA: Diagnosis not present

## 2020-12-02 DIAGNOSIS — I700 Atherosclerosis of aorta: Secondary | ICD-10-CM | POA: Diagnosis not present

## 2020-12-02 DIAGNOSIS — I10 Essential (primary) hypertension: Secondary | ICD-10-CM | POA: Diagnosis not present

## 2021-04-05 DIAGNOSIS — Z1231 Encounter for screening mammogram for malignant neoplasm of breast: Secondary | ICD-10-CM | POA: Diagnosis not present

## 2021-05-31 DIAGNOSIS — E7800 Pure hypercholesterolemia, unspecified: Secondary | ICD-10-CM | POA: Diagnosis not present

## 2021-05-31 DIAGNOSIS — Z0000 Encounter for general adult medical examination without abnormal findings: Secondary | ICD-10-CM | POA: Diagnosis not present

## 2021-05-31 DIAGNOSIS — M8580 Other specified disorders of bone density and structure, unspecified site: Secondary | ICD-10-CM | POA: Diagnosis not present

## 2021-05-31 DIAGNOSIS — Z1389 Encounter for screening for other disorder: Secondary | ICD-10-CM | POA: Diagnosis not present

## 2021-05-31 DIAGNOSIS — I10 Essential (primary) hypertension: Secondary | ICD-10-CM | POA: Diagnosis not present

## 2021-05-31 DIAGNOSIS — Z853 Personal history of malignant neoplasm of breast: Secondary | ICD-10-CM | POA: Diagnosis not present

## 2021-05-31 DIAGNOSIS — K219 Gastro-esophageal reflux disease without esophagitis: Secondary | ICD-10-CM | POA: Diagnosis not present

## 2021-05-31 DIAGNOSIS — R7303 Prediabetes: Secondary | ICD-10-CM | POA: Diagnosis not present

## 2021-05-31 DIAGNOSIS — M169 Osteoarthritis of hip, unspecified: Secondary | ICD-10-CM | POA: Diagnosis not present

## 2021-08-01 DIAGNOSIS — M85852 Other specified disorders of bone density and structure, left thigh: Secondary | ICD-10-CM | POA: Diagnosis not present

## 2021-12-02 DIAGNOSIS — M169 Osteoarthritis of hip, unspecified: Secondary | ICD-10-CM | POA: Diagnosis not present

## 2021-12-02 DIAGNOSIS — M8580 Other specified disorders of bone density and structure, unspecified site: Secondary | ICD-10-CM | POA: Diagnosis not present

## 2021-12-02 DIAGNOSIS — Z853 Personal history of malignant neoplasm of breast: Secondary | ICD-10-CM | POA: Diagnosis not present

## 2021-12-02 DIAGNOSIS — I700 Atherosclerosis of aorta: Secondary | ICD-10-CM | POA: Diagnosis not present

## 2021-12-02 DIAGNOSIS — R7303 Prediabetes: Secondary | ICD-10-CM | POA: Diagnosis not present

## 2021-12-02 DIAGNOSIS — I10 Essential (primary) hypertension: Secondary | ICD-10-CM | POA: Diagnosis not present

## 2022-04-11 DIAGNOSIS — Z1231 Encounter for screening mammogram for malignant neoplasm of breast: Secondary | ICD-10-CM | POA: Diagnosis not present

## 2022-04-24 DIAGNOSIS — R922 Inconclusive mammogram: Secondary | ICD-10-CM | POA: Diagnosis not present

## 2022-04-24 DIAGNOSIS — N6489 Other specified disorders of breast: Secondary | ICD-10-CM | POA: Diagnosis not present

## 2022-04-24 DIAGNOSIS — R928 Other abnormal and inconclusive findings on diagnostic imaging of breast: Secondary | ICD-10-CM | POA: Diagnosis not present

## 2022-04-24 DIAGNOSIS — R921 Mammographic calcification found on diagnostic imaging of breast: Secondary | ICD-10-CM | POA: Diagnosis not present

## 2022-06-05 DIAGNOSIS — R7303 Prediabetes: Secondary | ICD-10-CM | POA: Diagnosis not present

## 2022-06-05 DIAGNOSIS — I700 Atherosclerosis of aorta: Secondary | ICD-10-CM | POA: Diagnosis not present

## 2022-06-05 DIAGNOSIS — E7800 Pure hypercholesterolemia, unspecified: Secondary | ICD-10-CM | POA: Diagnosis not present

## 2022-06-05 DIAGNOSIS — Z853 Personal history of malignant neoplasm of breast: Secondary | ICD-10-CM | POA: Diagnosis not present

## 2022-06-05 DIAGNOSIS — I10 Essential (primary) hypertension: Secondary | ICD-10-CM | POA: Diagnosis not present

## 2022-06-05 DIAGNOSIS — K219 Gastro-esophageal reflux disease without esophagitis: Secondary | ICD-10-CM | POA: Diagnosis not present

## 2022-06-05 DIAGNOSIS — Z0000 Encounter for general adult medical examination without abnormal findings: Secondary | ICD-10-CM | POA: Diagnosis not present

## 2022-06-05 DIAGNOSIS — M5030 Other cervical disc degeneration, unspecified cervical region: Secondary | ICD-10-CM | POA: Diagnosis not present

## 2022-12-05 DIAGNOSIS — M169 Osteoarthritis of hip, unspecified: Secondary | ICD-10-CM | POA: Diagnosis not present

## 2022-12-05 DIAGNOSIS — I10 Essential (primary) hypertension: Secondary | ICD-10-CM | POA: Diagnosis not present

## 2022-12-05 DIAGNOSIS — K219 Gastro-esophageal reflux disease without esophagitis: Secondary | ICD-10-CM | POA: Diagnosis not present

## 2022-12-05 DIAGNOSIS — I700 Atherosclerosis of aorta: Secondary | ICD-10-CM | POA: Diagnosis not present

## 2022-12-05 DIAGNOSIS — Z853 Personal history of malignant neoplasm of breast: Secondary | ICD-10-CM | POA: Diagnosis not present

## 2022-12-05 DIAGNOSIS — R7303 Prediabetes: Secondary | ICD-10-CM | POA: Diagnosis not present

## 2023-03-05 DIAGNOSIS — M5431 Sciatica, right side: Secondary | ICD-10-CM | POA: Diagnosis not present

## 2023-04-17 DIAGNOSIS — Z1231 Encounter for screening mammogram for malignant neoplasm of breast: Secondary | ICD-10-CM | POA: Diagnosis not present

## 2023-04-20 DIAGNOSIS — M5431 Sciatica, right side: Secondary | ICD-10-CM | POA: Diagnosis not present

## 2023-05-01 DIAGNOSIS — M5432 Sciatica, left side: Secondary | ICD-10-CM | POA: Diagnosis not present

## 2023-05-01 DIAGNOSIS — M5431 Sciatica, right side: Secondary | ICD-10-CM | POA: Diagnosis not present

## 2023-06-12 DIAGNOSIS — Z853 Personal history of malignant neoplasm of breast: Secondary | ICD-10-CM | POA: Diagnosis not present

## 2023-06-12 DIAGNOSIS — Z9989 Dependence on other enabling machines and devices: Secondary | ICD-10-CM | POA: Diagnosis not present

## 2023-06-12 DIAGNOSIS — I10 Essential (primary) hypertension: Secondary | ICD-10-CM | POA: Diagnosis not present

## 2023-06-12 DIAGNOSIS — R7303 Prediabetes: Secondary | ICD-10-CM | POA: Diagnosis not present

## 2023-06-12 DIAGNOSIS — M85852 Other specified disorders of bone density and structure, left thigh: Secondary | ICD-10-CM | POA: Diagnosis not present

## 2023-06-12 DIAGNOSIS — M169 Osteoarthritis of hip, unspecified: Secondary | ICD-10-CM | POA: Diagnosis not present

## 2023-06-12 DIAGNOSIS — I700 Atherosclerosis of aorta: Secondary | ICD-10-CM | POA: Diagnosis not present

## 2023-06-12 DIAGNOSIS — Z0000 Encounter for general adult medical examination without abnormal findings: Secondary | ICD-10-CM | POA: Diagnosis not present

## 2023-06-12 DIAGNOSIS — Z23 Encounter for immunization: Secondary | ICD-10-CM | POA: Diagnosis not present

## 2023-06-12 DIAGNOSIS — M8580 Other specified disorders of bone density and structure, unspecified site: Secondary | ICD-10-CM | POA: Diagnosis not present

## 2023-06-12 DIAGNOSIS — E7800 Pure hypercholesterolemia, unspecified: Secondary | ICD-10-CM | POA: Diagnosis not present

## 2023-12-12 DIAGNOSIS — E7800 Pure hypercholesterolemia, unspecified: Secondary | ICD-10-CM | POA: Diagnosis not present

## 2023-12-12 DIAGNOSIS — Z853 Personal history of malignant neoplasm of breast: Secondary | ICD-10-CM | POA: Diagnosis not present

## 2023-12-12 DIAGNOSIS — R7303 Prediabetes: Secondary | ICD-10-CM | POA: Diagnosis not present

## 2023-12-12 DIAGNOSIS — M519 Unspecified thoracic, thoracolumbar and lumbosacral intervertebral disc disorder: Secondary | ICD-10-CM | POA: Diagnosis not present

## 2023-12-12 DIAGNOSIS — M8580 Other specified disorders of bone density and structure, unspecified site: Secondary | ICD-10-CM | POA: Diagnosis not present

## 2023-12-12 DIAGNOSIS — I10 Essential (primary) hypertension: Secondary | ICD-10-CM | POA: Diagnosis not present

## 2023-12-12 DIAGNOSIS — I700 Atherosclerosis of aorta: Secondary | ICD-10-CM | POA: Diagnosis not present

## 2024-01-09 DIAGNOSIS — M5451 Vertebrogenic low back pain: Secondary | ICD-10-CM | POA: Diagnosis not present

## 2024-01-17 DIAGNOSIS — M5451 Vertebrogenic low back pain: Secondary | ICD-10-CM | POA: Diagnosis not present

## 2024-01-24 DIAGNOSIS — M5451 Vertebrogenic low back pain: Secondary | ICD-10-CM | POA: Diagnosis not present

## 2024-01-31 DIAGNOSIS — M5451 Vertebrogenic low back pain: Secondary | ICD-10-CM | POA: Diagnosis not present

## 2024-02-07 DIAGNOSIS — M5451 Vertebrogenic low back pain: Secondary | ICD-10-CM | POA: Diagnosis not present

## 2024-04-22 DIAGNOSIS — Z1231 Encounter for screening mammogram for malignant neoplasm of breast: Secondary | ICD-10-CM | POA: Diagnosis not present

## 2024-07-08 ENCOUNTER — Encounter (HOSPITAL_BASED_OUTPATIENT_CLINIC_OR_DEPARTMENT_OTHER): Payer: Self-pay | Admitting: Orthopedic Surgery

## 2024-07-08 ENCOUNTER — Other Ambulatory Visit (HOSPITAL_COMMUNITY): Payer: Self-pay | Admitting: Internal Medicine

## 2024-07-08 DIAGNOSIS — G959 Disease of spinal cord, unspecified: Secondary | ICD-10-CM

## 2024-07-08 DIAGNOSIS — R29898 Other symptoms and signs involving the musculoskeletal system: Secondary | ICD-10-CM

## 2024-07-12 ENCOUNTER — Ambulatory Visit (HOSPITAL_COMMUNITY)
Admission: RE | Admit: 2024-07-12 | Discharge: 2024-07-12 | Disposition: A | Source: Ambulatory Visit | Attending: Internal Medicine | Admitting: Internal Medicine

## 2024-07-12 DIAGNOSIS — R29898 Other symptoms and signs involving the musculoskeletal system: Secondary | ICD-10-CM

## 2024-07-12 DIAGNOSIS — G959 Disease of spinal cord, unspecified: Secondary | ICD-10-CM
